# Patient Record
Sex: Female | Born: 1935 | Race: Black or African American | Hispanic: No | Marital: Single | Smoking: Never smoker
Health system: Southern US, Community
[De-identification: ages and names within clinical notes are randomized; demographics above are authoritative.]

## PROBLEM LIST (undated history)

## (undated) DIAGNOSIS — R51 Headache: Secondary | ICD-10-CM

## (undated) DIAGNOSIS — F0280 Dementia in other diseases classified elsewhere without behavioral disturbance: Secondary | ICD-10-CM

## (undated) DIAGNOSIS — E7800 Pure hypercholesterolemia, unspecified: Secondary | ICD-10-CM

## (undated) DIAGNOSIS — M1990 Unspecified osteoarthritis, unspecified site: Secondary | ICD-10-CM

## (undated) DIAGNOSIS — Z531 Procedure and treatment not carried out because of patient's decision for reasons of belief and group pressure: Secondary | ICD-10-CM

## (undated) DIAGNOSIS — F329 Major depressive disorder, single episode, unspecified: Secondary | ICD-10-CM

## (undated) DIAGNOSIS — D649 Anemia, unspecified: Secondary | ICD-10-CM

## (undated) DIAGNOSIS — F32A Depression, unspecified: Secondary | ICD-10-CM

## (undated) DIAGNOSIS — IMO0001 Reserved for inherently not codable concepts without codable children: Secondary | ICD-10-CM

## (undated) DIAGNOSIS — I509 Heart failure, unspecified: Secondary | ICD-10-CM

## (undated) DIAGNOSIS — I639 Cerebral infarction, unspecified: Secondary | ICD-10-CM

## (undated) DIAGNOSIS — E119 Type 2 diabetes mellitus without complications: Secondary | ICD-10-CM

## (undated) DIAGNOSIS — G309 Alzheimer's disease, unspecified: Secondary | ICD-10-CM

## (undated) DIAGNOSIS — I10 Essential (primary) hypertension: Secondary | ICD-10-CM

## (undated) DIAGNOSIS — G43909 Migraine, unspecified, not intractable, without status migrainosus: Secondary | ICD-10-CM

## (undated) DIAGNOSIS — I679 Cerebrovascular disease, unspecified: Secondary | ICD-10-CM

## (undated) DIAGNOSIS — K219 Gastro-esophageal reflux disease without esophagitis: Secondary | ICD-10-CM

## (undated) DIAGNOSIS — G459 Transient cerebral ischemic attack, unspecified: Secondary | ICD-10-CM

## (undated) DIAGNOSIS — I96 Gangrene, not elsewhere classified: Secondary | ICD-10-CM

## (undated) HISTORY — PX: CATARACT EXTRACTION W/ INTRAOCULAR LENS  IMPLANT, BILATERAL: SHX1307

---

## 1999-06-17 ENCOUNTER — Encounter: Admission: RE | Admit: 1999-06-17 | Discharge: 1999-06-17 | Payer: Self-pay | Admitting: Hematology and Oncology

## 1999-08-17 ENCOUNTER — Encounter: Admission: RE | Admit: 1999-08-17 | Discharge: 1999-08-17 | Payer: Self-pay | Admitting: Internal Medicine

## 1999-10-19 ENCOUNTER — Encounter: Admission: RE | Admit: 1999-10-19 | Discharge: 1999-10-19 | Payer: Self-pay | Admitting: Hematology and Oncology

## 1999-10-28 ENCOUNTER — Encounter: Admission: RE | Admit: 1999-10-28 | Discharge: 1999-10-28 | Payer: Self-pay | Admitting: *Deleted

## 2000-01-13 ENCOUNTER — Inpatient Hospital Stay (HOSPITAL_COMMUNITY): Admission: EM | Admit: 2000-01-13 | Discharge: 2000-01-14 | Payer: Self-pay | Admitting: Emergency Medicine

## 2000-01-13 ENCOUNTER — Encounter: Payer: Self-pay | Admitting: Emergency Medicine

## 2000-01-13 ENCOUNTER — Encounter: Payer: Self-pay | Admitting: Neurology

## 2000-01-25 ENCOUNTER — Encounter: Admission: RE | Admit: 2000-01-25 | Discharge: 2000-01-25 | Payer: Self-pay | Admitting: Internal Medicine

## 2000-03-27 ENCOUNTER — Encounter: Admission: RE | Admit: 2000-03-27 | Discharge: 2000-03-27 | Payer: Self-pay | Admitting: Internal Medicine

## 2000-10-03 ENCOUNTER — Encounter: Admission: RE | Admit: 2000-10-03 | Discharge: 2000-10-03 | Payer: Self-pay | Admitting: Internal Medicine

## 2000-10-31 ENCOUNTER — Encounter: Admission: RE | Admit: 2000-10-31 | Discharge: 2000-10-31 | Payer: Self-pay

## 2000-11-20 ENCOUNTER — Encounter: Admission: RE | Admit: 2000-11-20 | Discharge: 2000-11-20 | Payer: Self-pay | Admitting: Internal Medicine

## 2000-12-04 ENCOUNTER — Encounter: Admission: RE | Admit: 2000-12-04 | Discharge: 2000-12-04 | Payer: Self-pay | Admitting: Internal Medicine

## 2001-01-22 ENCOUNTER — Encounter: Admission: RE | Admit: 2001-01-22 | Discharge: 2001-01-22 | Payer: Self-pay | Admitting: Internal Medicine

## 2001-01-30 ENCOUNTER — Encounter: Admission: RE | Admit: 2001-01-30 | Discharge: 2001-01-30 | Payer: Self-pay | Admitting: Internal Medicine

## 2001-02-01 ENCOUNTER — Emergency Department (HOSPITAL_COMMUNITY): Admission: EM | Admit: 2001-02-01 | Discharge: 2001-02-01 | Payer: Self-pay | Admitting: Emergency Medicine

## 2001-02-10 ENCOUNTER — Emergency Department (HOSPITAL_COMMUNITY): Admission: EM | Admit: 2001-02-10 | Discharge: 2001-02-10 | Payer: Self-pay | Admitting: Emergency Medicine

## 2001-02-12 ENCOUNTER — Encounter: Admission: RE | Admit: 2001-02-12 | Discharge: 2001-02-12 | Payer: Self-pay | Admitting: Internal Medicine

## 2001-03-24 ENCOUNTER — Emergency Department (HOSPITAL_COMMUNITY): Admission: EM | Admit: 2001-03-24 | Discharge: 2001-03-25 | Payer: Self-pay | Admitting: Emergency Medicine

## 2001-03-28 ENCOUNTER — Encounter: Admission: RE | Admit: 2001-03-28 | Discharge: 2001-03-28 | Payer: Self-pay | Admitting: Internal Medicine

## 2001-04-26 ENCOUNTER — Encounter: Admission: RE | Admit: 2001-04-26 | Discharge: 2001-04-26 | Payer: Self-pay | Admitting: Internal Medicine

## 2001-05-11 ENCOUNTER — Encounter: Admission: RE | Admit: 2001-05-11 | Discharge: 2001-05-11 | Payer: Self-pay | Admitting: Internal Medicine

## 2001-11-10 HISTORY — PX: CARDIAC CATHETERIZATION: SHX172

## 2001-11-17 ENCOUNTER — Emergency Department (HOSPITAL_COMMUNITY): Admission: EM | Admit: 2001-11-17 | Discharge: 2001-11-17 | Payer: Self-pay | Admitting: Emergency Medicine

## 2001-11-17 ENCOUNTER — Encounter: Payer: Self-pay | Admitting: Emergency Medicine

## 2001-11-19 ENCOUNTER — Inpatient Hospital Stay (HOSPITAL_COMMUNITY): Admission: EM | Admit: 2001-11-19 | Discharge: 2001-11-22 | Payer: Self-pay | Admitting: Emergency Medicine

## 2001-11-19 ENCOUNTER — Encounter: Payer: Self-pay | Admitting: Emergency Medicine

## 2001-12-07 ENCOUNTER — Inpatient Hospital Stay (HOSPITAL_COMMUNITY): Admission: EM | Admit: 2001-12-07 | Discharge: 2001-12-08 | Payer: Self-pay | Admitting: Emergency Medicine

## 2001-12-07 ENCOUNTER — Encounter: Payer: Self-pay | Admitting: Emergency Medicine

## 2001-12-26 ENCOUNTER — Encounter: Admission: RE | Admit: 2001-12-26 | Discharge: 2001-12-26 | Payer: Self-pay | Admitting: Internal Medicine

## 2002-01-30 ENCOUNTER — Encounter: Admission: RE | Admit: 2002-01-30 | Discharge: 2002-01-30 | Payer: Self-pay | Admitting: Internal Medicine

## 2002-06-05 ENCOUNTER — Encounter: Admission: RE | Admit: 2002-06-05 | Discharge: 2002-06-05 | Payer: Self-pay | Admitting: Internal Medicine

## 2002-06-24 ENCOUNTER — Encounter: Admission: RE | Admit: 2002-06-24 | Discharge: 2002-06-24 | Payer: Self-pay | Admitting: Internal Medicine

## 2002-07-19 ENCOUNTER — Emergency Department (HOSPITAL_COMMUNITY): Admission: EM | Admit: 2002-07-19 | Discharge: 2002-07-19 | Payer: Self-pay | Admitting: Emergency Medicine

## 2002-10-30 ENCOUNTER — Encounter: Admission: RE | Admit: 2002-10-30 | Discharge: 2002-10-30 | Payer: Self-pay | Admitting: Internal Medicine

## 2002-11-19 ENCOUNTER — Encounter: Admission: RE | Admit: 2002-11-19 | Discharge: 2002-11-19 | Payer: Self-pay | Admitting: Internal Medicine

## 2002-12-10 ENCOUNTER — Encounter: Admission: RE | Admit: 2002-12-10 | Discharge: 2002-12-10 | Payer: Self-pay | Admitting: Internal Medicine

## 2002-12-30 ENCOUNTER — Encounter: Admission: RE | Admit: 2002-12-30 | Discharge: 2002-12-30 | Payer: Self-pay | Admitting: Internal Medicine

## 2003-01-08 ENCOUNTER — Encounter: Admission: RE | Admit: 2003-01-08 | Discharge: 2003-01-08 | Payer: Self-pay | Admitting: Internal Medicine

## 2003-01-30 ENCOUNTER — Encounter: Admission: RE | Admit: 2003-01-30 | Discharge: 2003-01-30 | Payer: Self-pay | Admitting: Internal Medicine

## 2003-03-06 ENCOUNTER — Encounter: Admission: RE | Admit: 2003-03-06 | Discharge: 2003-03-06 | Payer: Self-pay | Admitting: Internal Medicine

## 2003-04-02 ENCOUNTER — Encounter: Admission: RE | Admit: 2003-04-02 | Discharge: 2003-04-02 | Payer: Self-pay | Admitting: Internal Medicine

## 2003-06-07 ENCOUNTER — Inpatient Hospital Stay (HOSPITAL_COMMUNITY): Admission: EM | Admit: 2003-06-07 | Discharge: 2003-06-09 | Payer: Self-pay | Admitting: Emergency Medicine

## 2003-06-07 ENCOUNTER — Encounter: Payer: Self-pay | Admitting: Emergency Medicine

## 2003-06-09 ENCOUNTER — Encounter (INDEPENDENT_AMBULATORY_CARE_PROVIDER_SITE_OTHER): Payer: Self-pay | Admitting: Cardiology

## 2003-06-18 ENCOUNTER — Encounter: Admission: RE | Admit: 2003-06-18 | Discharge: 2003-06-18 | Payer: Self-pay | Admitting: Internal Medicine

## 2003-07-16 ENCOUNTER — Encounter: Admission: RE | Admit: 2003-07-16 | Discharge: 2003-07-16 | Payer: Self-pay | Admitting: Internal Medicine

## 2003-08-13 ENCOUNTER — Encounter: Admission: RE | Admit: 2003-08-13 | Discharge: 2003-08-13 | Payer: Self-pay | Admitting: Internal Medicine

## 2003-09-16 ENCOUNTER — Inpatient Hospital Stay (HOSPITAL_COMMUNITY): Admission: EM | Admit: 2003-09-16 | Discharge: 2003-09-24 | Payer: Self-pay | Admitting: Emergency Medicine

## 2003-09-17 ENCOUNTER — Encounter: Payer: Self-pay | Admitting: Internal Medicine

## 2003-11-04 ENCOUNTER — Encounter: Admission: RE | Admit: 2003-11-04 | Discharge: 2003-11-04 | Payer: Self-pay | Admitting: Internal Medicine

## 2003-12-23 ENCOUNTER — Encounter: Admission: RE | Admit: 2003-12-23 | Discharge: 2003-12-23 | Payer: Self-pay | Admitting: Internal Medicine

## 2004-01-27 ENCOUNTER — Encounter: Admission: RE | Admit: 2004-01-27 | Discharge: 2004-01-27 | Payer: Self-pay | Admitting: Internal Medicine

## 2004-03-19 ENCOUNTER — Encounter: Admission: RE | Admit: 2004-03-19 | Discharge: 2004-03-19 | Payer: Self-pay | Admitting: Internal Medicine

## 2004-08-26 ENCOUNTER — Ambulatory Visit (HOSPITAL_COMMUNITY): Admission: RE | Admit: 2004-08-26 | Discharge: 2004-08-26 | Payer: Self-pay | Admitting: Gastroenterology

## 2005-07-18 ENCOUNTER — Emergency Department (HOSPITAL_COMMUNITY): Admission: EM | Admit: 2005-07-18 | Discharge: 2005-07-18 | Payer: Self-pay | Admitting: Emergency Medicine

## 2006-02-15 ENCOUNTER — Emergency Department (HOSPITAL_COMMUNITY): Admission: EM | Admit: 2006-02-15 | Discharge: 2006-02-16 | Payer: Self-pay | Admitting: Emergency Medicine

## 2006-09-22 ENCOUNTER — Emergency Department (HOSPITAL_COMMUNITY): Admission: EM | Admit: 2006-09-22 | Discharge: 2006-09-22 | Payer: Self-pay | Admitting: Family Medicine

## 2007-05-08 ENCOUNTER — Emergency Department (HOSPITAL_COMMUNITY): Admission: EM | Admit: 2007-05-08 | Discharge: 2007-05-08 | Payer: Self-pay | Admitting: Emergency Medicine

## 2007-07-22 ENCOUNTER — Inpatient Hospital Stay (HOSPITAL_COMMUNITY): Admission: EM | Admit: 2007-07-22 | Discharge: 2007-07-24 | Payer: Self-pay | Admitting: Emergency Medicine

## 2007-11-19 ENCOUNTER — Emergency Department (HOSPITAL_COMMUNITY): Admission: EM | Admit: 2007-11-19 | Discharge: 2007-11-19 | Payer: Self-pay | Admitting: Emergency Medicine

## 2009-12-18 ENCOUNTER — Emergency Department (HOSPITAL_COMMUNITY): Admission: EM | Admit: 2009-12-18 | Discharge: 2009-12-18 | Payer: Self-pay | Admitting: Emergency Medicine

## 2010-08-19 ENCOUNTER — Inpatient Hospital Stay (HOSPITAL_COMMUNITY): Admission: EM | Admit: 2010-08-19 | Discharge: 2010-02-04 | Payer: Self-pay | Admitting: Emergency Medicine

## 2010-11-29 LAB — DIFFERENTIAL
Basophils Absolute: 0 10*3/uL (ref 0.0–0.1)
Basophils Absolute: 0.1 10*3/uL (ref 0.0–0.1)
Basophils Relative: 1 % (ref 0–1)
Eosinophils Absolute: 0 10*3/uL (ref 0.0–0.7)
Eosinophils Relative: 0 % (ref 0–5)
Lymphocytes Relative: 19 % (ref 12–46)
Lymphocytes Relative: 30 % (ref 12–46)
Lymphs Abs: 1.7 10*3/uL (ref 0.7–4.0)
Monocytes Absolute: 0.8 10*3/uL (ref 0.1–1.0)
Monocytes Relative: 9 % (ref 3–12)
Neutro Abs: 4.9 10*3/uL (ref 1.7–7.7)
Neutro Abs: 6.5 10*3/uL (ref 1.7–7.7)
Neutrophils Relative %: 72 % (ref 43–77)

## 2010-11-29 LAB — BASIC METABOLIC PANEL
BUN: 29 mg/dL — ABNORMAL HIGH (ref 6–23)
CO2: 27 mEq/L (ref 19–32)
Calcium: 9 mg/dL (ref 8.4–10.5)
GFR calc non Af Amer: 42 mL/min — ABNORMAL LOW (ref >60)
GFR calc non Af Amer: 48 mL/min — ABNORMAL LOW (ref >60)
Glucose, Bld: 113 mg/dL — ABNORMAL HIGH (ref 70–99)
Glucose, Bld: 160 mg/dL — ABNORMAL HIGH (ref 70–99)
Potassium: 4 mEq/L (ref 3.5–5.1)
Sodium: 141 mEq/L (ref 135–145)
Sodium: 144 mEq/L (ref 135–145)

## 2010-11-29 LAB — CBC
HCT: 31.1 % — ABNORMAL LOW (ref 36.0–46.0)
HCT: 33.4 % — ABNORMAL LOW (ref 36.0–46.0)
Hemoglobin: 10.4 g/dL — ABNORMAL LOW (ref 12.0–15.0)
Hemoglobin: 10.8 g/dL — ABNORMAL LOW (ref 12.0–15.0)
Hemoglobin: 9.7 g/dL — ABNORMAL LOW (ref 12.0–15.0)
MCHC: 32.3 g/dL (ref 30.0–36.0)
MCHC: 33.1 g/dL (ref 30.0–36.0)
MCV: 92.9 fL (ref 78.0–100.0)
MCV: 93.5 fL (ref 78.0–100.0)
Platelets: 179 10*3/uL (ref 150–400)
Platelets: 184 10*3/uL (ref 150–400)
Platelets: 202 10*3/uL (ref 150–400)
RBC: 3.57 MIL/uL — ABNORMAL LOW (ref 3.87–5.11)
RDW: 15.9 % — ABNORMAL HIGH (ref 11.5–15.5)
RDW: 16.6 % — ABNORMAL HIGH (ref 11.5–15.5)
RDW: 16.8 % — ABNORMAL HIGH (ref 11.5–15.5)
RDW: 16.9 % — ABNORMAL HIGH (ref 11.5–15.5)
WBC: 9.1 10*3/uL (ref 4.0–10.5)

## 2010-11-29 LAB — URINE CULTURE: Colony Count: >=100000

## 2010-11-29 LAB — COMPREHENSIVE METABOLIC PANEL
AST: 15 U/L (ref 0–37)
Albumin: 3 g/dL — ABNORMAL LOW (ref 3.5–5.2)
CO2: 27 mEq/L (ref 19–32)
Calcium: 9.5 mg/dL (ref 8.4–10.5)
Creatinine, Ser: 1.3 mg/dL — ABNORMAL HIGH (ref 0.4–1.2)
GFR calc Af Amer: 48 mL/min — ABNORMAL LOW (ref >60)
GFR calc non Af Amer: 39 mL/min — ABNORMAL LOW (ref >60)
Sodium: 143 mEq/L (ref 135–145)
Total Protein: 5.7 g/dL — ABNORMAL LOW (ref 6.0–8.3)

## 2010-11-29 LAB — POCT I-STAT, CHEM 8
BUN: 40 mg/dL — ABNORMAL HIGH (ref 6–23)
Calcium, Ion: 1.16 mmol/L (ref 1.12–1.32)
Chloride: 109 mEq/L (ref 96–112)
Creatinine, Ser: 1.3 mg/dL — ABNORMAL HIGH (ref 0.4–1.2)
Glucose, Bld: 259 mg/dL — ABNORMAL HIGH (ref 70–99)
HCT: 35 % — ABNORMAL LOW (ref 36.0–46.0)
Hemoglobin: 11.9 g/dL — ABNORMAL LOW (ref 12.0–15.0)
Potassium: 4.3 mEq/L (ref 3.5–5.1)
Sodium: 140 mEq/L (ref 135–145)
TCO2: 24 mmol/L (ref 0–100)

## 2010-11-29 LAB — GLUCOSE, CAPILLARY
Glucose-Capillary: 124 mg/dL — ABNORMAL HIGH (ref 70–99)
Glucose-Capillary: 137 mg/dL — ABNORMAL HIGH (ref 70–99)
Glucose-Capillary: 258 mg/dL — ABNORMAL HIGH (ref 70–99)
Glucose-Capillary: 260 mg/dL — ABNORMAL HIGH (ref 70–99)
Glucose-Capillary: 92 mg/dL (ref 70–99)

## 2010-11-29 LAB — URINE MICROSCOPIC-ADD ON

## 2010-11-29 LAB — LIPID PANEL
Cholesterol: 140 mg/dL (ref 0–200)
HDL: 55 mg/dL (ref >39)
LDL Cholesterol: 78 mg/dL (ref 0–99)

## 2010-11-29 LAB — URINALYSIS, ROUTINE W REFLEX MICROSCOPIC
Bilirubin Urine: NEGATIVE
Glucose, UA: NEGATIVE mg/dL
Hgb urine dipstick: NEGATIVE
Ketones, ur: NEGATIVE mg/dL
Nitrite: NEGATIVE
Protein, ur: NEGATIVE mg/dL
Specific Gravity, Urine: 1.024 (ref 1.005–1.030)
Urobilinogen, UA: 1 mg/dL (ref 0.0–1.0)
pH: 5 (ref 5.0–8.0)

## 2010-11-29 LAB — MAGNESIUM: Magnesium: 1.8 mg/dL (ref 1.5–2.5)

## 2010-11-29 LAB — AMMONIA: Ammonia: 17 umol/L (ref 11–35)

## 2010-11-29 LAB — TSH: TSH: 0.597 u[IU]/mL (ref 0.350–4.500)

## 2010-12-01 LAB — DIFFERENTIAL
Basophils Relative: 0 % (ref 0–1)
Lymphs Abs: 2.7 10*3/uL (ref 0.7–4.0)
Monocytes Relative: 9 % (ref 3–12)
Neutro Abs: 3.1 10*3/uL (ref 1.7–7.7)
Neutrophils Relative %: 48 % (ref 43–77)

## 2010-12-01 LAB — URINALYSIS, ROUTINE W REFLEX MICROSCOPIC
Hgb urine dipstick: NEGATIVE
Specific Gravity, Urine: 1.021 (ref 1.005–1.030)
Urobilinogen, UA: 1 mg/dL (ref 0.0–1.0)

## 2010-12-01 LAB — CBC
MCHC: 32.7 g/dL (ref 30.0–36.0)
RBC: 3.61 MIL/uL — ABNORMAL LOW (ref 3.87–5.11)
WBC: 6.5 10*3/uL (ref 4.0–10.5)

## 2010-12-01 LAB — URINE MICROSCOPIC-ADD ON

## 2010-12-01 LAB — BASIC METABOLIC PANEL
Calcium: 9.3 mg/dL (ref 8.4–10.5)
Creatinine, Ser: 1.41 mg/dL — ABNORMAL HIGH (ref 0.4–1.2)
GFR calc Af Amer: 43 mL/min — ABNORMAL LOW (ref >60)

## 2010-12-01 LAB — URINE CULTURE

## 2010-12-06 ENCOUNTER — Emergency Department (HOSPITAL_COMMUNITY): Payer: Medicare Other

## 2010-12-06 ENCOUNTER — Inpatient Hospital Stay (HOSPITAL_COMMUNITY)
Admission: EM | Admit: 2010-12-06 | Discharge: 2010-12-08 | DRG: 312 | Disposition: A | Discharge status: Skilled Nursing Facility | Payer: Medicare Other | Attending: Internal Medicine | Admitting: Internal Medicine

## 2010-12-06 DIAGNOSIS — I10 Essential (primary) hypertension: Secondary | ICD-10-CM | POA: Diagnosis present

## 2010-12-06 DIAGNOSIS — E785 Hyperlipidemia, unspecified: Secondary | ICD-10-CM | POA: Diagnosis present

## 2010-12-06 DIAGNOSIS — E119 Type 2 diabetes mellitus without complications: Secondary | ICD-10-CM | POA: Diagnosis present

## 2010-12-06 DIAGNOSIS — Z8673 Personal history of transient ischemic attack (TIA), and cerebral infarction without residual deficits: Secondary | ICD-10-CM

## 2010-12-06 DIAGNOSIS — Z794 Long term (current) use of insulin: Secondary | ICD-10-CM

## 2010-12-06 DIAGNOSIS — R55 Syncope and collapse: Principal | ICD-10-CM | POA: Diagnosis present

## 2010-12-06 DIAGNOSIS — K219 Gastro-esophageal reflux disease without esophagitis: Secondary | ICD-10-CM | POA: Diagnosis present

## 2010-12-06 DIAGNOSIS — F0390 Unspecified dementia without behavioral disturbance: Secondary | ICD-10-CM | POA: Diagnosis present

## 2010-12-06 DIAGNOSIS — I959 Hypotension, unspecified: Secondary | ICD-10-CM | POA: Diagnosis present

## 2010-12-06 LAB — CBC
MCH: 28.3 pg (ref 26.0–34.0)
Platelets: 201 10*3/uL (ref 150–400)
RBC: 4.06 MIL/uL (ref 3.87–5.11)
RDW: 16.7 % — ABNORMAL HIGH (ref 11.5–15.5)
WBC: 8.7 10*3/uL (ref 4.0–10.5)

## 2010-12-06 LAB — COMPREHENSIVE METABOLIC PANEL
BUN: 21 mg/dL (ref 6–23)
Calcium: 9 mg/dL (ref 8.4–10.5)
Creatinine, Ser: 1.11 mg/dL (ref 0.4–1.2)
GFR calc non Af Amer: 47 mL/min — ABNORMAL LOW (ref >60)
Glucose, Bld: 179 mg/dL — ABNORMAL HIGH (ref 70–99)
Total Protein: 6.7 g/dL (ref 6.0–8.3)

## 2010-12-06 LAB — OCCULT BLOOD, POC DEVICE: Fecal Occult Bld: NEGATIVE

## 2010-12-06 LAB — DIFFERENTIAL
Basophils Relative: 0 % (ref 0–1)
Eosinophils Absolute: 0.1 10*3/uL (ref 0.0–0.7)
Eosinophils Relative: 1 % (ref 0–5)
Neutrophils Relative %: 67 % (ref 43–77)

## 2010-12-06 LAB — POCT CARDIAC MARKERS
Myoglobin, poc: 61.1 ng/mL (ref 12–200)
Troponin i, poc: <0.05 ng/mL (ref 0.00–0.09)

## 2010-12-06 LAB — GLUCOSE, CAPILLARY: Glucose-Capillary: 164 mg/dL — ABNORMAL HIGH (ref 70–99)

## 2010-12-07 DIAGNOSIS — I319 Disease of pericardium, unspecified: Secondary | ICD-10-CM

## 2010-12-07 DIAGNOSIS — R55 Syncope and collapse: Secondary | ICD-10-CM

## 2010-12-07 LAB — CK TOTAL AND CKMB (NOT AT ARMC)
CK, MB: 0.4 ng/mL (ref 0.3–4.0)
CK, MB: 0.5 ng/mL (ref 0.3–4.0)
Relative Index: INVALID (ref 0.0–2.5)
Relative Index: INVALID (ref 0.0–2.5)
Total CK: 26 U/L (ref 7–177)
Total CK: 22 U/L (ref 7–177)

## 2010-12-07 LAB — TROPONIN I
Troponin I: 0.02 ng/mL (ref 0.00–0.06)
Troponin I: 0.03 ng/mL (ref 0.00–0.06)
Troponin I: 0.02 ng/mL (ref 0.00–0.06)

## 2010-12-07 LAB — COMPREHENSIVE METABOLIC PANEL
ALT: 20 U/L (ref 0–35)
AST: 32 U/L (ref 0–37)
Albumin: 3 g/dL — ABNORMAL LOW (ref 3.5–5.2)
Alkaline Phosphatase: 71 U/L (ref 39–117)
Calcium: 9.4 mg/dL (ref 8.4–10.5)
GFR calc Af Amer: >60 mL/min (ref >60)
Glucose, Bld: 97 mg/dL (ref 70–99)
Potassium: 4.8 mEq/L (ref 3.5–5.1)
Sodium: 141 mEq/L (ref 135–145)
Total Protein: 6.3 g/dL (ref 6.0–8.3)

## 2010-12-07 LAB — CBC
HCT: 33.7 % — ABNORMAL LOW (ref 36.0–46.0)
MCHC: 31.8 g/dL (ref 30.0–36.0)
RDW: 16.8 % — ABNORMAL HIGH (ref 11.5–15.5)
WBC: 6.5 10*3/uL (ref 4.0–10.5)

## 2010-12-07 LAB — GLUCOSE, CAPILLARY
Glucose-Capillary: 105 mg/dL — ABNORMAL HIGH (ref 70–99)
Glucose-Capillary: 88 mg/dL (ref 70–99)
Glucose-Capillary: 123 mg/dL — ABNORMAL HIGH (ref 70–99)

## 2010-12-07 LAB — DIFFERENTIAL
Basophils Absolute: 0 10*3/uL (ref 0.0–0.1)
Basophils Relative: 1 % (ref 0–1)
Eosinophils Relative: 2 % (ref 0–5)
Lymphocytes Relative: 44 % (ref 12–46)
Monocytes Absolute: 0.6 10*3/uL (ref 0.1–1.0)
Neutro Abs: 2.9 10*3/uL (ref 1.7–7.7)

## 2010-12-08 ENCOUNTER — Other Ambulatory Visit (HOSPITAL_COMMUNITY): Payer: Medicaid Other

## 2010-12-08 LAB — COMPREHENSIVE METABOLIC PANEL
AST: 27 U/L (ref 0–37)
CO2: 26 mEq/L (ref 19–32)
Calcium: 9.1 mg/dL (ref 8.4–10.5)
Creatinine, Ser: 0.81 mg/dL (ref 0.4–1.2)
GFR calc Af Amer: >60 mL/min (ref >60)
GFR calc non Af Amer: >60 mL/min (ref >60)
Total Protein: 6.1 g/dL (ref 6.0–8.3)

## 2010-12-08 LAB — VITAMIN B12: Vitamin B-12: 507 pg/mL (ref 211–911)

## 2010-12-08 LAB — CBC
Hemoglobin: 10.8 g/dL — ABNORMAL LOW (ref 12.0–15.0)
MCH: 28.2 pg (ref 26.0–34.0)
MCHC: 32.3 g/dL (ref 30.0–36.0)
Platelets: 196 10*3/uL (ref 150–400)
RDW: 16.6 % — ABNORMAL HIGH (ref 11.5–15.5)

## 2010-12-08 LAB — MAGNESIUM: Magnesium: 1.9 mg/dL (ref 1.5–2.5)

## 2010-12-08 LAB — RPR: RPR Ser Ql: NONREACTIVE

## 2010-12-08 LAB — GLUCOSE, CAPILLARY: Glucose-Capillary: 148 mg/dL — ABNORMAL HIGH (ref 70–99)

## 2010-12-08 LAB — LIPID PANEL
HDL: 45 mg/dL (ref >39)
Triglycerides: 51 mg/dL (ref <150)

## 2010-12-15 NOTE — Discharge Summary (Signed)
[name], [name]                 ACCOUNT NO.:  [id number]  MEDICAL RECORD NO.:  [id number]           PATIENT TYPE:  I  LOCATION:  3737                         FACILITY:  [facility]  PHYSICIAN:  Thad Ranger, MD       DATE OF BIRTH:  Jan 21, 1927  DATE OF ADMISSION:  12/06/2010 DATE OF DISCHARGE:                              DISCHARGE SUMMARY   PRIMARY CARE PHYSICIAN:  Bennett Scrape, MD with the [facility].  DISCHARGE DIAGNOSIS: 1. Syncopal episode likely vasovagal. 2. Hypertension, poorly controlled. 3. Diabetes type 2. 4. Gastroesophageal reflux disease. 5. Hypotension. 6. Hyperlipidemia. 7. History of cerebrovascular accident.  DISCHARGE MEDICATIONS: 1. Clonidine 0.1 mg p.o. b.i.d. 2. Tylenol 325 mg 2 tablets p.o. every 6 hours as needed for     pain/fever. 3. Aricept 10 mg p.o. daily at bedtime. 4. Beneprotein 1 scoop p.o. t.i.d. 5. Aspirin 81 mg daily. 6. Certa-Vite 1 tablet p.o. daily. 7. Citalopram 10 mg p.o. daily. 8. Ferrous sulfate 325 mg p.o. daily. 9. Lasix 20 mg p.o. q.a.m. 10.Lantus 5 units subcu daily at bedtime. 11.Lisinopril 40 mg p.o. daily. 12.Nexium 40 mg p.o. q.a.m. 13.NovoLog insulin sliding scale. 14.Phenergan 25 mg 1 tab p.o. every 4 hours as needed for nausea. 15.Potassium 10 mEq p.o. daily. 16.Simvastatin 40 mg p.o. daily. 17.Toprol-XL 100 mg p.o. daily. 18.Vicodin 5/500, 1 tablet p.o. every 6 hours as needed for pain. 19.Namenda 10 mg p.o. b.i.d.  HISTORY OF PRESENT ILLNESS:  At the time of admission, [name] is a 12- year-old female with history of diabetes type 2, mild dementia. Apparently was at the nursing home and she had an episode of syncope. It was unclear exactly how the syncope occurred or who witnessed it. The patient otherwise denied any fever, chills, chest pain, palpitation, shortness of breath, nausea, vomiting, or any bright red blood per rectum or black stools.  The patient was admitted for workup of  syncope. Chest x-ray 2-view March 26, emphysematous and bronchitic changes with mild chronic right basilar atelectasis, enlargement of the cardiac silhouette, no acute infiltrates.  CT head without contrast on March 26 showed atrophy with small vessel chronic ischemic changes of deep cerebral white matter, tiny old basal ganglia, lacunar infarcts, no acute intracranial abnormalities, pacified left mastoid air cells, question mastoiditis.  A 2-D echo March 27, EF of 50-55%, no definitive wall motion abnormalities, grade 1 diastolic dysfunction.  Carotid Doppler's March 27, no significant extracranial carotid artery stenosis demonstrated.  PERTINENT LAB DIAGNOSTIC DATA: 1. HbA1c 6.8 with a mean plasma glucose of 148.  Cardiac enzymes     remained negative x3. 2. Lipid profile:  Cholesterol 129, HDL 45, LDL 74. 3. TSH 1.0. 4. B12 507.  BRIEF HOSPITALIZATION COURSE:  [name] is a 77 year old female with history of dementia, hypertension, hyperlipidemia, history of CVA presented with a syncopal episode. 1. Syncope, likely vasovagal.  The patient was admitted to the     monitored floor.  She underwent 2-D echocardiogram and was     essentially normal EF with 50-55% and no wall motion abnormalities.     Carotid  ultrasound also showed no ICA stenosis.  CT head did not     show any acute stroke.  Given that the patient had CVA with old     basal ganglia lacunar infarcts and small vessel chronic ischemic     changes, the patient was started on low-dose aspirin. 2. Hypertension was somewhat poorly controlled.  The patient was     continued on lisinopril, Toprol-XL, and Lasix.  Clonidine was     started during the hospitalization. 3. Dementia remained stable.  The patient was continued on Aricept and     Namenda. 4. Diabetes mellitus.  The patient was continued on Lantus and sliding     scale insulin while inpatient.  PHYSICAL EXAMINATION:  VITAL SIGNS AT THE TIME OF  DICTATION: Temperature 98.7, pulse 64, respirations 18, blood pressure 171/81, O2 sats 99% on room air.  Currently verbalizing at the baseline. HEENT:  Negative sclerae and conjunctivae.  Pupils reactive to light and accommodation.  EOMI. NECK:  Supple.  No lymphopathy, no JVD. CARDIOVASCULAR:  S1, S2 clear.  Regular rate and rhythm. CHEST:  Clear to auscultation bilaterally. ABDOMEN:  Soft, nontender, no bowel sounds. EXTREMITIES:  No cyanosis or clubbing.  LAB DIAGNOSTIC DATA TODAY:  Sodium 141, potassium 3.8, BUN 17, creatinine 0.8, albumin 2.8.  Sodium 141, potassium 3.8, BUN 17, creatinine 0.8.  DISCHARGE FOLLOWUP:  With [facility], Dr. Providence Crosby.  DISCHARGE TIME:  35 minutes.     Thad Ranger, MD    RR/MEDQ  D:  12/08/2010  T:  12/08/2010  Job:  [id number]  cc:   Bennett Scrape, MD  Electronically Signed by Andres Labrum Caelan Atchley  on 12/15/2010 04:35:49 PM

## 2011-01-12 NOTE — H&P (Signed)
[name], [name]                 ACCOUNT NO.:  [id number]  MEDICAL RECORD NO.:  [id number]           PATIENT TYPE:  E  LOCATION:  [facility]                         FACILITY:  [facility]  PHYSICIAN:  Massie Maroon, MD        DATE OF BIRTH:  09/24/1926  DATE OF ADMISSION:  12/06/2010 DATE OF DISCHARGE:                             HISTORY & PHYSICAL   CHIEF COMPLAINT:  Syncope.  HISTORY OF PRESENT ILLNESS:  This is an 77 year old female with a history of diabetes type 2, mild dementia apparently was at nursing home, when she had an episode of syncope.  It is unclear exactly how syncope occurred and who witnessed it.  The patient is not sure how long episode of syncope occurred.  The patient denies any fever, chills, cough, chest pain, palpitations, shortness of breath, nausea, vomiting, bright red blood per rectum or black stool.  The patient was brought to the ED for evaluation.  CT brain was negative for any acute process. There were tiny old basal ganglia lacunar infarcts in opacified left mastoid air cells question mastoiditis.  However, there was no tenderness over the mastoid processes.  Chest x-ray was unremarkable, other than COPD and bronchitic changes and some mild chronic right basilar atelectasis.  EKG showed normal sinus rhythm at 55 common left axis deviation, poor R-wave progression, Q-waves in V1, V2, V3 with ST elevation, which appears to be old.  The patient will be admitted for workup of syncope.  PAST MEDICAL HISTORY: 1. Hypertension. 2. Hyperlipidemia. 3. CAD status post MI. 4. TIA/CVA with right-sided residual effects. 5. Dementia, poor historian. 6. GERD. 7. UTI. 8. Anemia.  PAST SURGICAL HISTORY:  November 21, 2001, left heart cath severe apical disease of the LAD with involvement of some of the diagonal branches, EF 70-75%, 70-75% segment segmental focal segmental lesion of the AV circumflex supplying the PDA from September 23, 2003, AV nodal reentry tachycardia  status post invasive electrophysiological study with isopropanol infusion and radiofrequency energy catheter ablation, August 26, 2004, colonoscopy diverticulosis of the left colon to the hepatic flexure, August 26, 2004.  EGD-mild gastritis.  Angioplasty and C-section.  SOCIAL HISTORY:  The patient is a long-term resident [facility].  She does not smoke or drink.  FAMILY HISTORY:  Unknown due to the patient is a poor historian due to dementia.  Both parents are deceased.  Per prior HPI, family history of hypertension, mother died at age 47 with diabetes, hypertension.  Father died at an unknown age for no cause.  One sibling.  ALLERGIES:  No known drug allergies.  MEDICATIONS: 1. Vicodin 5/500 mg one p.o. q.6 h. P.r.n. 2. Phenergan 25 mg p.o. q.4 h. P.r.n. 3. Tylenol 650 mg p.o. q.6 h. P.r.n. 4. NovoLog sliding scale. 5. Lantus 5 units subcu at bedtime. 6. Certa-Vite multivitamin 1 p.o. daily. 7. Aricept 10 mg p.o. daily. 8. Beneprotein powder one scoop t.i.d. 9. Simvastatin 40 mg p.o. daily. 10.Namenda 10 mg p.o. b.i.d. 11.Lexapro 10 mg p.o. daily. 12.Potassium chloride 10 mEq p.o. daily. 13.Furosemide 20 mg p.o. daily. 14.Ferrous sulfate  325 mg p.o. daily. 15.Nexium 40 mg p.o. q.a.m. 16.Lisinopril 40 mg p.o. daily. 17.Toprol-XL 100 mg p.o. daily.  REVIEW OF SYSTEMS:  Unable to obtain due to the patient's dementia.  PHYSICAL EXAMINATION:  VITAL SIGNS:  Temperature 98.1, pulse 60, blood pressure 127/86, pulse ox 100% on room air. HEENT: Anicteric. NECK:  No JVD, no bruit. HEART:  Regular rate and rhythm.  S1 and S2.  No murmurs, gallops or rubs. LUNGS:  Clear to auscultation bilaterally. ABDOMEN:  Soft, nontender, nondistended.  Positive bowel sounds. EXTREMITIES:  No cyanosis, clubbing or edema. SKIN:  No rashes. LYMPH NODES:  No adenopathy. NEURO:  Nonfocal.  Cranial nerves II through XII intact.  Reflexes 2+, symmetric, diffuse with  downgoing toes bilaterally, motor strength 5/5 in all 4 extremities, pinprick intact.  LABORATORY DATA:  WBC 8.7, hemoglobin 11.5, platelet count 201. Troponin-I less than 0.05.  Sodium 134, potassium 4.4, BUN 21, creatinine 1.11, AST 26, ALT 21, alk phos 75, total bilirubin 0.3. Hemoccult stool negative.  CT brain, December 06, 2010, atrophy with small vessel chronic ischemic changes of the deep cerebral white matter, tiny old basal ganglia, lacunar infarcts, opacified left mastoid air cells, question mastoiditis.  Chest x-ray; COPD and bronchitic changes with mild chronic right basilar atelectasis, enlargement of the cardiac silhouette.  No acute infiltrate.  ASSESSMENT/PLAN: 1. Syncope.  Check orthostatic blood pressures.  The patient was     placed on telemetry.  We will check CK, CK-MB, troponin I q.6 h. x3     sets.  Check a carotid ultrasound and cardiac 2-D echo. 2. Diabetes type 2.  Fingerstick blood sugars a.c. and h.s. NovoLog     sensitive sliding scale.  Continue Lantus. 3. Dementia.  Continue Aricept.  Continue Namenda.  Continue Certa-     Vite. 4. Gastroesophageal reflux disease.  Continue Nexium. 5. Hypertension.  Continue lisinopril, Toprol-XL, and furosemide. 6. Hyperlipidemia:  Continue simvastatin. 7. History of cerebrovascular accident.  Start aspirin 81 mg p.o.     daily. 8. Deep venous thrombosis prophylaxis.  SCDs.     Massie Maroon, MD     JYK/MEDQ  D:  12/07/2010  T:  12/07/2010  Job:  [id number]  Electronically Signed by Pearson Grippe MD on 01/12/2011 11:24:26 PM

## 2011-01-28 NOTE — Discharge Summary (Signed)
. [facility]  Patient:    [name], [name]                        MRN: [id number] Adm. Date:  [id number] Disc. Date: [id number] Attending:  Glean Hess D CC:         Nancee Liter, M.D.,             [facility]                           Discharge Summary  DISCHARGE DIAGNOSES: 1. Vertebrobasilar insufficiency. 2. Hypertension. 3. Diabetes mellitus.  PROCEDURES AND INTERVENTIONS: 1. MRI/MRA of the brain. 2. Two-dimensional echocardiogram.  HOSPITAL COURSE:  Patient is a 77 year old woman who presented to [facility] emergency room with sudden onset the morning of admission, around 4:00 a.m., of double vision, nausea, and difficulty walking.  Patient denied weakness of the upper or lower extremities.  Upon admission, her neurological examination showed patient awake and alert with fluent speech, normal memory and language, pupils equal and reactive bilaterally, extraocular, cephalic movement normal.  She did revert to double vision on extreme bilateral gaze but her eyes were not disconjugate.  Also noted was a smaller palpebral fissure on the left eye but the patient ______ has been there since her last cataract surgery and is not a new finding for her.  Face symmetric, tongue in the midline.  Motor examination with strength equal bilaterally.  Patient was admitted to the hospital for further workup and testing for cerebrovascular disease in view of her risk factors.  She underwent an MRI/MRA of the brain which showed no acute infarctions on diffusion______ images.  It is well known that, in about 10 to 15% of cases, we can miss acute infarctions on diffusion images acutely.  MRA did not show any hemodynamic stenosis of the vertebrobasilar system.  A 2D echocardiogram showed normal LV function with no embolic sources of the cause of her stroke.  Patient was evaluated by PT and OT without further recommendations as an  outpatient.  During her hospital stay, her neurological status improved significantly.  She still had some double vision in bilateral gaze.  Patient was discharged home in stable condition.  She was placed on Plavix 75 mg once a day for secondary long-term stroke prevention.  She was advised not to drive and to call Dr. Gwendolyn Fill for outpatient follow-up in the residence clinic in two to three weeks from discharge. DD:  01/14/00 TD:  01/17/00 Job: [id number]

## 2011-01-28 NOTE — Consult Note (Signed)
[place]  Patient:    [name], [name] Visit Number: [id number] MRN: [id number]          Service Type: MED Location: [phone] Attending Physician:  Edwyna Perfect Dictated by:   Everardo Beals Juanda Chance, M.D. [facility] Proc. Date: 11/19/01 Admit Date:  11/19/2001                            Consultation Report  CONSULTING PHYSICIAN:  Medical Teaching Service and Dr. Bruce Donath.  REASON FOR CONSULTATION:  Evaluation of chest pain.  CLINICAL HISTORY:  [name] is 77 year old woman who has diabetes, hypertension and hyperlipidemia and a history of a remote myocardial infarction.  She has had intermittent chest pain for the past three days and was seen yesterday at [facility] and released.  She came back today to [facility] with recurrent chest pain.  She describes the pain as substernal and an aching quality and lasting for hours at a time.  She has had no exertional component.  There is no associated shortness of breath, nausea, vomiting or diaphoresis.  PAST MEDICAL HISTORY:  Her past medical history is significant for her hypertension, hyperlipidemia and diabetes.  She also has a history of GERD and glaucoma.  CURRENT MEDICATIONS: 1. Glucovance 5/500 b.i.d. 2. Lisinopril 40 mg q.d. 3. Aciphex 20 mg q.d. 4. Atenolol 50 mg q.d.  SOCIAL HISTORY:  She lives alone but her daughter lives with her.  She does not smoke and does not drink.  FAMILY HISTORY AND REVIEW OF SYSTEMS:  Please see [name], P.A.s note.  PHYSICAL EXAMINATION:  VITAL SIGNS:  Blood pressure 199/89 and a pulse 67 and regular and the respirations 20.  SKIN:  Warm and dry.  HEENT:  The pupils were equal and round and extraocular movements were full. There was no venous distention.  NECK:  The carotid pulses were full.  There was no thyroid enlargement.  CHEST:  Clear without rales or rhonchi.  CARDIAC:  The cardiac rhythm was regular.  First and second  heart sounds were normal and there were no murmurs or gallops.  ABDOMEN:  Soft without organomegaly.  The bowel sounds were normal.  EXTREMITIES:  Good pulses and no peripheral edema.  There were no musculoskeletal deformities.  NEUROLOGIC:  Examination was normal.  LABORATORY AND ACCESSORY DATA:  An EKG showed an old anteroseptal infarction and left axis deviation.  Her CKs and troponins were negative.  Her BUN and creatinine were 16/0.8.  IMPRESSION: 1. Chest pain suggestive of unstable angina. 2. Old anteroseptal myocardial infarction by electrocardiogram. 3. Hypertension. 4. Diabetes. 5. Hyperlipidemia.  RECOMMENDATIONS:  I am concerned that [name] symptoms may represent ischemia, especially in view of her multiple risk factors and abnormal ECG. If her pressure remains up, I would add an ACE inhibitor and would also put her on heparin.  I am concerned that she may be at high risk and would recommend evaluation with angiography. Dictated by:   Everardo Beals Juanda Chance, M.D. [facility] Attending Physician:  Edwyna Perfect DD:  11/19/01 TD:  11/20/01 Job: [id number]

## 2011-01-28 NOTE — H&P (Signed)
[place]  Patient:    [name], [name]                        MRN: [id number] Adm. Date:  [id number] Attending:  Doug Sou                         History and Physical  CHIEF COMPLAINT:  Double vision.  HISTORY OF PRESENT ILLNESS:  The patient is a 77 year old woman who earlier this morning around 4 a.m. developed the sudden onset of double vision, nausea, and difficulty walking.  She denies weakness of upper and lower extremities. Denies slurred speech.  The patient does refer that she was leaning towards the left side.  She has a prior history of a stroke which had left her with paresthesias on the  left side of her body.  PAST MEDICAL HISTORY: 1. Insulin-dependent diabetes mellitus. 2. Stroke. 3. Hypertension.  CURRENT MEDICATIONS: 1. Glyburide 5 mg once q.d. 2. Humulin 70/30, 10 units at night and 7 units in the morning p.r.n. 3. Aspirin one or two tablets once q.d. 4. Furosemide 80 mg once q.d. 5. Zestril 40 mg one q.d. 6. Atenolol 50b mg one q.d. 7. Catapres patch weekly, the dose which the patient does not remember    at this time.  ALLERGIES:  No known drug allergies.  [facility] care [facility]  SOCIAL HISTORY:  Lives with her daughter.  She is a nonsmoker, nondrinker.  She is very active and independent.  She still drives.  She is separated from her husband.  FAMILY HISTORY:  Mother had a stroke.  REVIEW OF SYSTEMS:  As per the history of present illness.  PHYSICAL EXAMINATION:  VITAL SIGNS:  Blood pressure 210/113, pulse 73, respirations 20, temperature 98.1 degrees.  GENERAL:  The patient is mildly obese, laying on the stretcher, in no distress.  HEENT:  Head normocephalic, atraumatic.  NECK:  Supple, no bruits.  LUNGS:  Clear bilaterally.  HEART:  Sounds, regular rhythm.  No murmurs.  ABDOMEN:  Soft, bowel sounds present.  No  visceromegaly.  EXTREMITIES:  With +1-2 edema.  NEUROLOGIC:  Awake, alert, oriented.  Speech fluent.  Memory and language appropriate for age.  Her pupils are equal, reactive bilaterally.  Extraocular cephalic movements intact.  She does refer diplopia on extreme right gaze. Also noted the left palpebral fissure was smaller than the right.  Her face is slightly asymmetric with decreased nasal labial fold on the right side.  Motor examination, strength equal bilaterally.  There is no drift.  Deep tendon reflexes +1 throughout.  Plantars downgoing.  Gait evaluation was deferred.  NEURO IMAGING:  A CT scan of the head which I personally reviewed shows an old posterior _______ capsular infarction.  No acute ischemia.  No hemorrhage.  IMPRESSION: 1. Stroke, vertebral basilar distribution. 2. Hypertension. 3. Diabetes mellitus.  The plan, recommendations, diagnosis, condition, and further intervention were discussed at length with the patient at the bedside.  Will admit the patient to the stroke unit for further workup and testing.  Management will consist of hemodynamic support, IV fluids, normal saline at 50 cc an hour, oxygen 2 L, and a heparin ischemic stroke protocol.  Will further obtain noninvasive imaging of the internal and external cranial vessel with MRI/MRA of the brain, and a 2-D echocardiogram for evaluation of embolic sources as the cause of  a stroke.  The patient will remain n.p.o. on bedrest.  Will obtain PT, OT consultation in the morning.  Dr. Nancee Liter was notified of the patients admission. DD:  01/13/00 TD:  01/13/00 Job: [id number]

## 2011-01-28 NOTE — Op Note (Signed)
[name], [name]                           ACCOUNT NO.:  [id number]   MEDICAL RECORD NO.:  [id number]                   PATIENT TYPE:  INP   LOCATION:  2907                                 FACILITY:  [facility]   PHYSICIAN:  Duke Salvia, M.D.               DATE OF BIRTH:  September 21, 1926   DATE OF PROCEDURE:  09/23/2003  DATE OF DISCHARGE:                                 OPERATIVE REPORT   PREOPERATIVE DIAGNOSIS:  Supraventricular tachycardia.   POSTOPERATIVE DIAGNOSIS:  AV nodal re-entry tachycardia.   OPERATION PERFORMED:  Invasive electrophysiologic study with isoproterenol  infusion, arrhythmia mapping, radio frequency energy catheter ablation with  repeat efficacy study.   SURGEON:  Duke Salvia, M.D.   DESCRIPTION OF PROCEDURE:  Following the obtaining of informed consent, the  patient was brought to the electrophysiology laboratory and placed on the  fluoroscopic table in supine position.  After routine prep and drape,  cardiac catheterization was performed with local anesthesia and conscious  sedation.  Noninvasive blood pressure monitoring, transcutaneous oxygen  saturation monitoring and end tidal CO2 monitoring were performed  continuously throughout the procedure.  Following the procedure, the  catheter was removed.  Hemostasis was obtained and the patient was  transferred to her room in stable condition.   CATHETERS:  A 5 French quadripolar catheter was inserted via the left  femoral vein to the AV junction to measure the His electrogram.  A 5 French quadripolar catheter was inserted via the left femoral vein to  the right ventricular apex.  A 5 French quadripolar catheter was inserted via the left femoral vein to  the high right atrium.  A 6 French octapolar catheter was inserted via the right femoral vein to the  coronary sinus and subsequently moved to the AV junction to measure the His  electrogram.  A 4 mm deflectable tip catheter was inserted via a Swartz  (SL2) sheath to  mapping sites in the posterior septal space.   Surface leads 1, aVF, and V1 were monitored continuously throughout the  procedure.  Following insertion of the catheters, the stimulation protocol  included incremental atrial pacing, incremental ventricular pacing, single  and double atrial extrastimuli, pace cycle length of 600 and 500 msec from  the high right atrium and the coronary sinus.  Burst atrial pacing.   RESULTS:  Surface electrocardiogram basic intervals.  __________  Rhythm is sinus initial, sinus final.  Cycle length is 818 msec initial, 610 msec final.  PR interval is 181 msec initial, 175 msec final.  QRS duration is 160 msec initial, 100 msec final.  QT interval is 421 msec initial, 340 msec final.  P wave duration is 160 msec, 128 msec final.  Bundle branch block is absent and absent.  Pre-excitation is absent and absent.  AH interval is 64 msec initial and 112 msec final.  HV interval is 53 msec initial and 40  msec final.  His bundle duration appeared to be normal initial and normal final.   AV NODAL FUNCTION:  AV Wenckebach cycle length is 315 msec in the absence of  isoproterenol and there was decremental conduction in the presence and in  the absence of isoproterenol with ventricular pacing.  AV nodal discontinuity was easily demonstrated with double atrial  extrastimuli.  Following ablation, no evidence of discontinuity could be  identified.   ACCESSORY PATHWAY FUNCTION:  No evidence of an accessory pathway was  identified.   VENTRICULAR RESPONSE TO PROGRAMMED STIMULATION:  Normal as for ventricular  stimulation as described above.   ARRHYTHMIAS INDUCED:  AV nodal re-entry tachycardia was difficultly and not  reproducibly inducible with atrial pacing at 380 msec.  The tachycardia  cycle length was 453 msec and during a typical episode of tachycardia, the  HA interval was 100 msec.  The AH interval was 343 msec.  VA time was 62  msec.  The  V high right atrial time was 90 msec.   Characteristics of the tachycardia included VA conduction was earliest in  His bundle electrogram.   Radio frequency energy.  A total of 10 applications of radio frequency  energy were applied with junctional rhythm seen on three of the 10  applications.  On none of them did the junctional rhythm last more than  approximately 5 to 10 seconds.  However, after the first significant  junctional rhythm, PR intervals were then shorter then RR intervals and  tachycardia was no longer inducible and no evidence of dual AV nodal  physiology was identified.  However, given the short burst, another  application of radio frequency energy was applied with junction rhythm used  as a surrogate end point.   FLUOROSCOPY TIME:  A total of approximately 15 minutes of fluoroscopy time  was utilized at 7.5 frames per second.   IMPRESSION:  1. Normal sinus function.  2. Normal atrial function.  3. Dual AV nodal physiology with inducible AV nodal re-entry tachycardia.     RF catheter ablation successfully interrupted antegrade slow pathway     function eliminating the substrate for the patient's arrhythmia     mechanism.  4. Normal His Purkinje system function.  5. No accessory pathway.  6. Normal ventricular response to programmed stimulation.   CONCLUSION:  The results of electrophysiologic testing confirmed AV nodal re-  entry tachycardia as the patient's clinical arrhythmia notwithstanding the  somewhat long RP interval noted on surface electrocardiogram.  It is  consistent with the patient's symptoms.  Slow pathway modification was  successfully accomplished notwithstanding significant difficulties in (a)  identification of the His electrogram ultimately requiring an octapolar  catheter and (b)  significant cephalad displacement of the coronary sinus os and (c) possibly because of the abnormal anatomy as suggested above, difficulty in getting  good  junctional rhythm.  However, no evidence of substrate for the patient's  arrhythmia was identified after the patient's procedure.                                               Duke Salvia, M.D.    SCK/MEDQ  D:  09/23/2003  T:  09/24/2003  Job:  [id number]   cc:   C. Ulyess Mort, M.D.  [address]  [place]  [place] [address]  Fax: [phone]

## 2011-01-28 NOTE — Consult Note (Signed)
[name], [name]                           ACCOUNT NO.:  [id number]   MEDICAL RECORD NO.:  [id number]                   PATIENT TYPE:  INP   LOCATION:  2907                                 FACILITY:  [facility]   PHYSICIAN:  Jesse Sans. Wall, M.D.                DATE OF BIRTH:  April 20, 1927   DATE OF CONSULTATION:  09/17/2003  DATE OF DISCHARGE:                                   CONSULTATION   REASON FOR CONSULTATION:  The patient is a very pleasant 77 year old female  with known coronary artery disease and multiple cardiac risk factors, who is  now seen in consultation for evaluation of recurrent supraventricular  tachycardia and abnormal cardiac enzymes.  The patient's cardiac history is notable for previous coronary angiogram in  March 2003, by Dr. Arturo Morton. Stuckey, revealing moderate diffuse coronary  artery disease with evidence of small vessel diabetic disease, with the  recommendation to treat medically.  Notable, there was an 80% apical diffuse  lesion in the LAD which was not amenable to percutaneous intervention.  Some  residual anatomy notable for a 70% ramus intermedius, a 60% obtuse marginal,  and a 70%-75% AV/circumflex.  The RCA (non-dominant), had no significant  disease.  The LV function at that time was preserved at 54%.  Prior to presentation the patient denied any symptoms suggestive of unstable  angina pectoris or congestive heart failure.  On the morning of admission,  she noted tachy palpitations and felt dizzy and lightheaded, but denied any  frank syncope.  She also had some shortness of breath, but denied any  associated chest pain or diaphoresis.  On presentation to the emergency room she was found in narrow complex  tachycardia at 140 beats per minute and was treated with 18 mg of adenosine  and 50 mg of Metoprolol.  She subsequently converted to a sinus bradycardia.  She did, however, have an 11-beat run of WCP earlier this morning.  This  was, however,  asymptomatic.  Serial cardiac markers are trending up, with a  current troponin I level of 0.6 and a peak MB of 10.7.  TSH is pending.  Additional notable labs include a potassium of 3.1 this morning.  Beta  natriuretic peptide was 329, with no evidence of congestive heart failure on  chest x-ray.   ALLERGIES:  No known drug allergies.   CURRENT MEDICATIONS:  1. Coated aspirin.  2. Plavix.  3. Lisinopril 40 mg daily.  4. Zocor 40 mg daily.  5. Metoprolol 25 mg q.8h.  6. Lasix 40 mg daily.  7. Intravenous heparin.  8. Sliding scale insulin.   PAST MEDICAL HISTORY:  1. Status post supraventricular tachycardia/abnormal cardiac markers in     September 2004.  2. Type 2 diabetes mellitus.  3. Hypertension.  4. History of hypokalemia.  5. History of a stroke/vertebral basilar insufficiency.  6. Hypercholesterolemia.  7. Gastroesophageal reflux disease.  8. A 2-D echocardiogram in September 2004:  Ejection fraction of 40%-50%,     mild diffuse LV HK, mild MR, several small pericardial effusions.   SOCIAL HISTORY:  The patient lives alone here in [place] but is quite  self-sufficient and does not need the assistance of a walker or cane.  She  has never smoked tobacco and does not drink alcohol.  She has nine children.   FAMILY HISTORY:  Father deceased at age 55, most likely secondary to a  myocardial infarction.   REVIEW OF SYSTEMS:  Negative for fever, productive cough, chills, sweats,  hemoptysis, hematochezia, melena, orthopnea, PND, tachy palpitations or  syncope.  The remaining systems are negative.   PHYSICAL EXAMINATION:  VITAL SIGNS:  Blood pressure 145/55, pulse 50,  regular, respirations 18, temperature 97.1 degrees.  SAO2 was 100% (room  air).  GENERAL:  A 77 year old female, in no apparent distress.  HEENT:  Normocephalic, atraumatic.  NECK:  Bilateral carotid pulses without bruits.  LUNGS:  Clear to auscultation in all fields.  HEART:  A regular rate and  rhythm, (S1, S2), a soft non-pathologic murmur in  upper LC.  ABDOMEN:  Soft, nontender with intact bowel sounds.  No bruits.  EXTREMITIES:  Diminished dorsalis pedis pulses with no pedal edema.  NEUROLOGIC:  No focal deficits.  Chest x-ray:  No active disease.  Electrocardiogram:  On admission revealed a supraventricular tachycardia of  140 beats per minute with 1-2 mm ST depression in lead II, aVF, V4-V6, 1 mm  ST depression in lead I, ST elevation in lead V1 and V2.   LABORATORY DATA:  WBC 7, hemoglobin 11.9, hematocrit 36, platelets 189.  Sodium 141, potassium 3.1, (3.3 on admission), BUN 15, creatinine 0.9,  glucose 155.  TSH pending.  Beta natriuretic peptide 329.   IMPRESSION:  1. Recurrent supraventricular tachycardia     a. Status post previous supraventricular tachycardia episodes in        September 2004, also associated with abnormal cardiac enzymes.  2. Non-ST elevation myocardial infarction     a. Most likely secondary to physiologic stress test supraventricular        tachycardia.  3. Ischemic cardiomyopathy     a. Status post coronary angiogram in March 2003; medical therapy        recommended.     b. An ejection fraction of 54% by cardiac catheterization (ejection        fraction 40%-50% by recent echocardiogram in September 2004).  4. Hypokalemia.  5. Type 2 diabetes mellitus.  6. Hypertension.  7. Hypercholesterolemia.   RECOMMENDATIONS:  The recommendation is to continue cycling the cardiac  enzymes, repeat potassium, and check a magnesium level.  TSH level is  currently pending.  We also will repeat a 2-D echocardiogram for  reassessment of the LV function and, if the ejection fraction is less than  or equal to 35%, refer the patient for  electrophysiology evaluation.  We will also schedule an adenosine Cardiolite  for the morning, to rule out ischemia.  Of note, if the patient has frequently-occurring SVT, then she will also be referred for an EP   evaluation, for consideration of ablation.     Gene Serpe, P.A. [facility]                      Thomas C. Wall, M.D.    GS/MEDQ  D:  09/17/2003  T:  09/17/2003  Job:  [id number]   cc:   [facility]

## 2011-01-28 NOTE — Discharge Summary (Signed)
[name], [name]                           ACCOUNT NO.:  [id number]   MEDICAL RECORD NO.:  [id number]                   PATIENT TYPE:  INP   LOCATION:  3309                                 FACILITY:  [facility]   PHYSICIAN:  Madaline Guthrie, M.D.                 DATE OF BIRTH:  1927/04/19   DATE OF ADMISSION:  06/07/2003  DATE OF DISCHARGE:  06/09/2003                                 DISCHARGE SUMMARY   DISCHARGE DIAGNOSES:  1. Supraventricular tachycardia.  2. Acute myocardial infarction.  3. Severe coronary artery disease.  4. Diabetes mellitus.  5. Hypertension.  6. Hypokalemia.  7. Cerebrovascular disease status post stroke in the past.  8. Gastritis.  9. Vertebrobasilar insufficiency.  10.      Diastolic heart failure.  11.      Hyperlipidemia.   DISCHARGE MEDICATIONS:  1. Toprol-XL 50 mg a day.  2. Aspirin 325 mg a day.  3. Prinivil 40 mg a day.  4. Xalatan one drop each daily.  5. Zocor 40 mg a day.  6. Plavix 75 mg a day.  7. Aciphex 20 mg a day.   The patient was specifically instructed to stop taking reserpine, Ditropan,  and hydrochlorothiazide.   FOLLOWUP APPOINTMENT:  The patient had appointment on October 18 at 10:00 in  the internal medicine outpatient clinic.   CONSULTATIONS:  None.   PROCEDURE:  Two-D echocardiogram was done on September 27.   HISTORY OF PRESENT ILLNESS:  The patient is a 77 year old African-American  female with a history of severe multivessel coronary artery disease which is  deemed inoperable. He was brought to the emergency room because of dizziness  and tachycardia. The patient reported that she had been in her usual state  of health prior to date of admission. She had woken up the morning of  admission at 6 a.m. and noticed that her heart rate was fast. She became  dizzy after walking around the house for a while and had to lay down. The  patient denied any vertigo, chest pain, nausea, vomiting, diaphoresis,  shortness of  breath, or palpitations. The patient also denied any fever or  chills recently.   PHYSICAL EXAMINATION:  VITAL SIGNS:  On exam, vital signs were with a  temperature of 98.7, pulse of 81 at the time that we examined her, blood  pressure 126/70, respiratory rate 20, oxygen saturation was  99% on 2  liters.  GENERAL:  IN general, the patient was a well-nourished female who appeared  stated age in no acute distress.  HEENT:  Eyes were nonicteric. Equal and reactive to light. ENT:  The patient  was missing some teeth but oropharynx was clear with moist mucous membranes.  NECK:  Supple without masses. Respirations were clear, symmetric.  CARDIOVASCULAR:  Regular rate and rhythm with no murmurs, rubs, or gallops.  ABDOMEN:  Positive bowel sounds in all four  quadrants. Soft, nontender.  EXTREMITIES:  Without edema without calf tenderness.  SKIN:  Dry and warm.  MUSCULOSKELETAL:  The patient had 4/5 bilaterally in the lower extremities  and 5/5 strength in the upper extremities. Strength was symmetric without  any focal defects.  NEUROLOGICAL:  Alert and oriented x4. Cranial nerves II-XII grossly intact.  PSYCHIATRIC EXAM:  Full affect, linear thought processes, good mood.   LABORATORY DATA:  BMET was with a sodium of 138, potassium of 3.3, chloride  104, bicarb 24, BUN 13, creatinine 0.7, glucose 181. CBC was with a white  blood cell count of 8.0, hemoglobin 12.3, platelets 202,000. Cardiac enzymes  in the ER with a CK of 57, CK-MB of 1.4, troponin I of 0.01. Chest x-ray:  There was no acute disease. EKG:  When she initially came into the ER was a  ventricular rate of 167 and revealed supraventricular tachycardia. Also  revealed a left axis deviation which was not knew, septal infarct which was  not new, and some marked ST abnormalities which again were not new as  compared with EKG from July 2002. At the time that we saw her, EKG revealed  a ventricular rate of 75 beats per minute with again  the septal infarct,  left axis deviation.   HOSPITAL COURSE:  1. Supraventricular tachycardia. The patient received three doses of     adenosine in the ER without any success in rate control or converting     supraventricular tachycardia. The patient was then placed on the Cardizem     drip in the ER with good rate control. The patient converted to normal     sinus rhythm before evaluated the patient. We admitted the patient to a     telemetry bed and started her on beta blockers for rate control. The     cause of SVT was examined including a TSH and magnesium which were within     normal limits and an 2-D echocardiogram which revealed no etiology for     the disease. The patient was taken off of her home Ditropan and reserpine     as these are noted to cause tachycardia and sometimes arrhythmias.     Cardiac enzymes were drawn serially. Cardiac enzymes did bump with a     troponin I of 0.45 and a CK-MB of 4.3 and CK of 76. The patient was     diagnosed with an acute MI.  2. Acute myocardial infarction. The patient was started on aspirin, heparin,     beta blockers, Zocor, and eventually Plavix. As patient was with known     coronary artery disease which was inoperable and previously asymptomatic,     she was monitored for 48 hours for any arrhythmias and was discharged to     home in good condition.  3. Coronary artery disease status post a catheterization in March of 2003     showing inoperable multivessel disease. The patient was managed medically     with medications as mentioned above. Plavix was added to her home regimen     of medications.  4. Diabetes mellitus. Metformin was held. During the hospitalization, the     patient was managed with sliding scale insulin without any complications.  5. Hypertension. Reserpine was discontinued, and patient was managed with a     beta blocker and lisinopril. Hydrochlorothiazide was stopped because    patient wished to be taken off of some  of her medications.  6. Hypokalemia. At  the time of admission, potassium was decreased at 3.3.     The patient was on hydrochlorothiazide at the time of admission, and the     hypokalemia was attributed to this. The patient was supplemented with     potassium and responded well to this. During the hospitalization, the     patient was taken off of hydrochlorothiazide, and so she was not     discharged on potassium for home.   DISCHARGE LABORATORY DATA:  At the time of discharge, the patient had a CBC  with a white blood cell count of 7.4, hemoglobin 12.1, hematocrit 36.1,  platelet count 201,000. A BMET was with a sodium of 142, potassium of 3.5,  chloride 103, CO2 of 31, glucose of 140, BUN 9, creatinine 0.8. LFTs were  all within normal limits. Last set of cardiac enzymes was with a CK of 67,  CK-MB of 1.3, troponin I of 0.14.   DISPOSITION:  The patient was discharged to home with family. The patient  has a followup appointment on October 18 at 10 a.m. in the internal medicine  outpatient clinic.   ISSUES AT FOLLOWUP:  The patient needs maximum medical management of  coronary artery disease, titration of beta blocker to maximum tolerated  dose, and careful monitoring of lipid status and hypertension.      Manning Charity, MD                        Madaline Guthrie, M.D.    KK/MEDQ  D:  07/29/2003  T:  07/29/2003  Job:  [id number]

## 2011-01-28 NOTE — Op Note (Signed)
[name], [name]                 ACCOUNT NO.:  [id number]   MEDICAL RECORD NO.:  [id number]          PATIENT TYPE:  AMB   LOCATION:  ENDO                         FACILITY:  [facility]   PHYSICIAN:  Graylin Shiver, M.D.   DATE OF BIRTH:  [birth year]   DATE OF PROCEDURE:  08/26/2004  DATE OF DISCHARGE:                                 OPERATIVE REPORT   PROCEDURE:  Esophagogastroduodenoscopy with biopsy for CLOtest.   INDICATION:  The patient complains of __________ and a lot of nausea and  intermittent vomiting.   Informed consent was obtained after explanation of the risks of bleeding,  infection, and perforation.   PREMEDICATIONS:  1.  Fentanyl 50 mcg IV.  2.  Versed 4 mg IV.   DESCRIPTION OF PROCEDURE:  With the patient in the left lateral decubitus  position, the Olympus gastroscope was inserted into the oropharynx and  passed into the esophagus.  It was advanced down the esophagus, then into  the stomach and into the duodenum.  The second portion and bulb of the  duodenum were normal.  The stomach showed some mild erythema compatible with  gastritis, and biopsy for CLOtest was obtained.  No lesions were seen in the  fundus or cardia.  The dissection looked normal in its entirety.  She  tolerated this procedure well without complication.   IMPRESSION:  Mild gastritis.   PLAN:  CLOtest will be checked.       SFG/MEDQ  D:  08/26/2004  T:  08/26/2004  Job:  [id number]   cc:   Fleet Contras, M.D.  [address]  [place]  [place] [address]  Fax: [phone]

## 2011-01-28 NOTE — Discharge Summary (Signed)
[place]. [facility]  Patient:    [name], [name] Visit Number: [id number] MRN: [id number]          Service Type: Attending:  C. Ulyess Mort, M.D. Dictated by:   Gertha Calkin, M.D. Adm. Date:  11/19/01 Disc. Date: 11/22/01   CC:         Everardo Beals. Juanda Chance, M.D. [facility]  Maisie Fus D. Riley Kill, M.D. [facility]   Discharge Summary  DATE OF BIRTH:  16-Nov-1926  DISCHARGE DIAGNOSES: 1. Angina. 2. Coronary artery disease. 3. Hypertension. 4. Hyperlipidemia. 5. Diabetes, type 2.  DISCHARGE MEDICATIONS: 1. Enteric-coated aspirin 325 mg p.o. q.d. 2. Hydrochlorothiazide 25 mg p.o. q.d. 3. Imdur 30 mg p.o. q.d. 4. K-Dur 20 mEq p.o. q.d. 5. Norvasc 5 mg p.o. q.d. 6. Prinivil 10 mg p.o. q.d. 7. Atenolol 50 mg p.o. q.d. 8. Zocor 20 mg p.o. q.d. 9. Nitrostat sublingual 0.4 mg p.o. q.29m. x 3.  FOLLOW-UP:  With Dr. ______, [facility].  The patient is to call for her clinic appointment.  CONSULTANTS:  [facility].  BRIEF HISTORY OF PRESENT ILLNESS:  A 77 year old African-American female with history of diabetes, hypertension, and reflux disease, who is a patient of Dr. Raylene Miyamoto, presents with chest pain x 3 days.  She described this pain as a dull ache and is unsure of the duration.  She states that it is under her left breast with no radiation and not related to exertion.  She denies nausea, vomiting, shortness of breath, fever, chills, or cough.  She does have some left arm pain; however, this is secondary to movement.  No localized tenderness.  She is unaware of her cardiac work-up in the past and during evaluation in the ED, had no chest pain.  Her risk factors include age, diabetes, hypertension, and hyperlipidemia.  2-D echocardiogram done November 2001, showed normal left ventricular ejection fraction.  She does have a positive family history as well with the father with MI at 46 and a sister who had MI at 73 years of  age.  PAST MEDICAL HISTORY: 1. Diabetes, most recent hemoglobin A1c down August 2002, was 7.2. 2. Hypertension. 3. Hyperlipidemia, total cholesterol of 268 and LDL of 173.  This was done in    August 2002. 4. Gastroesophageal reflux disease.  MEDICATIONS: 1. Glucophage 500 mg p.o. b.i.d. 2. Lisinopril 10 mg p.o. q.d. 3. Aciphex 20 mg p.o. q.d.  SOCIAL HISTORY:  Lives alone.  Has never smoked in her life.  No alcohol or drug use.  REVIEW OF SYSTEMS:  Negative, otherwise per HPI.  PHYSICAL EXAMINATION:  VITAL SIGNS:  Temperature 97.7, pulse 52, blood pressure 201/91 repeat was 180/90.  Respirations are 20 with 90% O2 saturations on room air.  GENERAL:  African-American female in no acute distress.  NECK:  Normal.  No JVD, no bruits.  CARDIOVASCULAR:  Bradycardic but regular rhythm and no local tenderness to palpation.  No murmurs, rubs, gallops.  RESPIRATORY:  Clear to auscultation.  No wheezing present.  Good breath sounds.  ABDOMEN:  Within normal limits.  Good bowel sounds.  EXTREMITIES:  No clubbing, cyanosis, or edema.  Pulses are present bilaterally and symmetric.  LABORATORY DATA:  A 12-lead EKG showed sinus bradycardia for 53 beats per minute, Q waves consistent with previous anterior infarct.  There was some mild ST-T wave finding in the lateral leads.  This was the only new change in comparison to an old EKG in July 2002.  Chest x-ray showed  cardiomegaly; however, no infiltrate and no active disease present.  BNP was normal.  CBC was normal.  PT was normal.  First set cardiac enzymes:  CK of 46, CK-MB of .8 with troponin I level of .05.  HOSPITAL COURSE:  A 77 year old female with multiple cardiac risk factors was admitted for angina and to be ruled out.  The patient did rule out with cardiac enzymes; however, given multiple risk factors, cardiology was consulted, and cardiac cath was performed by Dr. Riley Kill on November 21, 2001. This showed overall EF of  54%; however, some diffuse disease present.  Please see Dr. Louretta Shorten complete cath report.  It was deemed given that she was more than likely not to benefit from CABG, that medical treatment was prudent at this time.  She is already on a beta blocker, aspirin, nitrates, ACE inhibitor, calcium channel blocker as well as a diuretic.  Goal at this point is to control her hypertension with titration of her current medications.  She is discharged to follow up with her primary M.D., who is Dr. Juanda Chance. Dr. Delia Chimes office will contact patient with her follow-up appointment. Dictated by:   Gertha Calkin, M.D. Attending:  C. Ulyess Mort, M.D. DD:  03/24/02 TD:  03/26/02 Job: [id number]

## 2011-01-28 NOTE — Discharge Summary (Signed)
NAMEELIANNA, WINDOM                           ACCOUNT NO.:  [id number]   MEDICAL RECORD NO.:  [id number]                   PATIENT TYPE:  INP   LOCATION:  2907                                 FACILITY:  [facility]   PHYSICIAN:  C. Ulyess Mort, M.D.             DATE OF BIRTH:  Feb 14, 1927   DATE OF ADMISSION:  09/16/2003  DATE OF DISCHARGE:  09/24/2003                                 DISCHARGE SUMMARY   DISCHARGE DIAGNOSES:  1. Atrioventricular nodal reentry tachycardia, status post radial frequency     energy catheter ablation on September 23, 2003.  2. Abnormal cardiac enzymes secondary to arrhythmia/supraventricular     tachycardia.  Cardiolite negative on September 18, 2003.  3. Coronary artery disease.  Cardiac catheterization in March of 2003 with     ejection fraction 54, severe apical disease of the left anterior     descending, and 70-75% segmental lesion of the circumflex artery.  4. Diabetes mellitus, type 2.  Hemoglobin A1C 7.7 on September 17, 2003.  5. Hypokalemia.  6. Hypercapnia.  7. HDM  8. Anemia, normocytic, with hemoglobin 11.6.   DISCHARGE MEDICATIONS:  1. Aspirin 325 mg p.o. daily.  2. Plavix 75 mg p.o. daily.  3. Protonix 40 mg p.o. daily.  4. Lopressor 25 mg twice a day.  5. Lisinopril 40 mg p.o. daily.  6. Zocor 40 mg p.o. daily.  7. Glucotrol 5 mg p.o. daily.  8. HCTZ 25 mg p.o. daily.   DISPOSITION:  The patient was discharged home.  She has an appointment on  October 14, 2003, with Dr. Shon Baton in the outpatient clinic.  She has an  appointment on October 14, 2003, with Dr. Corinda Gubler.   CONSULTS:  Cardiologists, Duke Salvia, M.D., and Jesse Sans. Wall, M.D.   PROCEDURES:  1. Cardiolite stress test on September 18, 2003.  2. Electrophysiology study with radiofrequency energy catheter ablation on     September 23, 2003.   HISTORY OF PRESENT ILLNESS:  Mrs. Frankie is a 77 year old lady with known  cardiovascular disease, status post recent known ST elevated  myocardial  infarction with inoperable CAD, who was brought to the ED today for  lightheadedness.  She reports waking up as usual today and taking her  medication when she started feeling dizzy.  She did not have any chest pain,  but she was a little dyspneic.  She sat down and started to do her usual  activities, but found it difficult.  Her daughter called her and advised her  to call 911.   PHYSICAL EXAMINATION:  VITAL SIGNS:  Pulse 140, blood pressure 122/81,  temperature 97 degrees, respiratory rate 14, oxygen saturation 100 on 2 L.  GENERAL APPEARANCE:  She was in no acute distress.  _________  HEENT:  Eyes:  PERRLA.  EOMs were intact.  ENT:  Missing frontal incisors.  NECK:  No JVD.  No carotid bruits.  RESPIRATORY:  Coarse breath sounds in bases.  No wheezes.  No rales.  CARDIOVASCULAR:  Regular rate and rhythm.  Tachycardic.  GASTROINTESTINAL:  Soft, obese, and nontender.  Bowel sounds present.  EXTREMITIES:  1+ pedal edema.  Decreased pulses bilaterally on anterior  dorsalis pedis.  SKIN:  Cold and dry.  NEUROLOGIC:  Grossly intact.   LABORATORIES ON ADMISSION:  White blood cell count 6.9, hemoglobin 12.6,  platelets 217, MCV 89.  The pH was 7.49 and pCO2 52.6.  BNP 329.  Sodium  138, potassium 3.3, chloride 105, CO2 25, BUN 50, creatinine 0.9, blood  glucose 144.  Anion gap 8.  CK-MB 3.6 and 5.5, troponin 0.5 and 2.11, and  myoglobin 73.7 and 81.2.   HOSPITAL COURSE:  Problem #1 - ATRIOVENTRICULAR NODAL REENTRANT TACHYCARDIA:  The patient was admitted with evidence of supraventricular tachycardia on  EKG.  Her cardiac enzymes were elevated.  The differential diagnosis  included non-ST elevation myocardial infarction versus physiologic stress  test (supraventricular tachycardia).  The patient went to Cardiolite stress  test which showed no evidence for ischemia.  During her hospital stay, the  patient had a onetime run of nonsustained ventricular tachycardia which was   attributed to electrolyte imbalance, such as hypokalemia.  The patient was  repleted with potassium chloride.  The patient was seen by Dr. Daleen Squibb and Dr.  Graciela Husbands.  On September 23, 2003, she was taken for elective electrophysiology  studies by Dr. Graciela Husbands and radiofrequency ablation was performed.  The patient  was discharged in stable condition and sinus rhythm.   Problem #2 - ABNORMAL CARDIAC ENZYMES:  This problem was thought secondary  to arrhythmias, such as supraventricular tachycardia as a stress for  myocardium.  Cardiolite was done on September 18, 2003, and was negative.   Problem #3 - DIABETES MELLITUS, TYPE 2:  On admission, the hemoglobin A1C  was 7.7.  The patient was started Lantus insulin with regular insulin before  meals.  Then she was started on Glucotrol home dose and 2.5 mg and increased  to 5 mg p.o. daily.  The patient was discharged on Glucotrol 5 mg p.o.  daily.  Hypokalemia was probably secondary diuretics and the patient was  repleted with potassium chloride.   Problem #4 - HYPERLIPIDEMIA:  The patient was on Zocor 40 mg p.o. daily.   LABORATORIES ON DISCHARGE:  White blood cell count 7.7, red blood cell count  3.725, hemoglobin 11.2, hematocrit 53.5, MCV 89.3, RDW 13.8, and platelets  171.  Sodium 142, potassium 4.1, chloride 110, CO2 25, glucose 127, BUN 10,  creatinine 0.8, calcium 9.8.  Iron 84, folate 590, ferritin 197, B12 518,  magnesium 2.4.  TSH 1.189.      Mont Dutton, MD                           Gary Fleet, M.D.    Wallace Keller  D:  10/17/2003  T:  10/19/2003  Job:  [id number]

## 2011-01-28 NOTE — Op Note (Signed)
[place]  Patient:    [name], [name] Visit Number: [id number] MRN: [id number]          Service Type: MED Location: [phone] Attending Physician:  Edwyna Perfect Dictated by:   Arturo Morton Riley Kill, M.D. [facility] Proc. Date: 11/21/01 Admit Date:  11/19/2001   CC:         Bruce Donath, M.D.  Bruce Elvera Lennox Juanda Chance, M.D. [facility]  CV Laboratory  C. Ulyess Mort, M.D.   Operative Report  INDICATIONS: The patient is a delightful 77 year old female, with a history of both diabetes and hypertension. She also has hyperlipidemia. She lives alone and does not smoke cigarettes. She recently presented with some chest pain and the chest pain was worrisome for coronary ischemia. Her cardiac markers were negative. There is an old anteroseptal MI by ECG.  PROCEDURES: 1. Left heart catheterization. 2. Selective coronary arteriography. 3. Selective left ventriculography.  DESCRIPTION OF PROCEDURE: The procedure was performed from the right femoral artery using 6 French catheters.  She tolerated the procedure well. At completion of procedure we did give her some labetalol. We also gave her some nitroglycerin during the course of the procedure.  HEMODYNAMIC DATA: 1. The central aortic pressure 189/85. 2. Left ventricular pressure 185/25. 3. No aortic left ventricular gradient on pullback across the aortic valve.  ANGIOGRAPHIC DATA: 1. Ventriculography performed in the RAO projection reveals very mild global    hypokinesis, possibly related to long-standing hypertension. Ejection    fraction was calculated at 54%. I did not see what appeared to be    significant mitral regurgitation. 2. The left main coronary artery was a large caliber vessel being about    6 mm in size and without significant focal narrowing. 3. The left anterior descending artery demonstrates perhaps 20% proximal    narrowing. There is a septal perforator that has about 70% narrowing.    The  LAD beyond this point is relatively smooth down to the apical tip where    there is severe apical disease of diffuse 80% narrowing and a bifurcating    vessel distally, which has a typical diabetic appearance. This suggest    diffuse diabetic disease. There is a optional diagonal or ramus intermedius    that has 70% proximal narrowing. There is a diagonal that is small in    caliber and has about 90% ostial narrowing. There is a third diagonal    which has about 70% proximal narrowing. There is a forth diagonal without    significant disease. 4. The circumflex proper is a dominant vessel. There was about 30% ostial    segmental narrowing. The vessel opens up and provides a large marginal    branch which bifurcates and has about 50% narrowing in the superior    sub-branch. The AV circumflex then terminates as a posterior descending    branch. The AV circumflex has about 70-75% narrowing after the origin of    the OM. 5. The right coronary artery is a nondominant vessel with some luminal    irregularity but no high-grade areas of focal disease.  CONCLUSIONS: 1. Preserved overall left ventricular function. 2. Severe apical disease of the LAD with also involvement of some of the    diagonal branches which represent small diffusely diseased diabetic    vessels. 3. There is 70-75% segmental focal segmental lesion of the arteriovenous    circumflex supplying the posterior descending artery system, which is    amenable to percutaneous  intervention, but unlikely the current cause of    chest pain.  DISPOSITION: At the present time, my leaning would be in the direction of medical therapy. Her severe LAD disease at the apex is not amenable either to percutaneous intervention nor is it amenable to revascularization surgery. The only amenable lesion really is in the area of the AV circumflex, and my leaning would be in the direction of medical therapy for this given her diabetes and the lack of  drug-eluding stents at the present time. She will be followed in cardiology as well as general medicine clinic. Dictated by:   Arturo Morton Riley Kill, M.D. [facility] Attending Physician:  Edwyna Perfect DD:  11/21/01 TD:  11/22/01 Job: [id number]

## 2011-01-28 NOTE — Op Note (Signed)
[name], [name]                 ACCOUNT NO.:  [id number]   MEDICAL RECORD NO.:  [id number]          PATIENT TYPE:  AMB   LOCATION:  ENDO                         FACILITY:  [facility]   PHYSICIAN:  Graylin Shiver, M.D.   DATE OF BIRTH:  1927/03/01   DATE OF PROCEDURE:  08/26/2004  DATE OF DISCHARGE:                                 OPERATIVE REPORT   PROCEDURE:  Colonoscopy to the hepatic flexure.   INDICATION FOR PROCEDURE:  History of weight loss, screening.   Informed consent was obtained after explanation of the risks of bleeding,  infection, and perforation.   PREMEDICATIONS:  The procedure was done after an EGD with an additional 50  mcg of Fentanyl and 4 mg of Versed given.   DESCRIPTION OF PROCEDURE:  With the patient in the left lateral decubitus  position, a rectal exam was performed; no masses were felt.  The Olympus  colonoscope was inserted into the rectum and advanced around a very tortuous  colon to the region of the hepatic flexure.  Despite all maneuvers, I could  not advance the scope beyond this point.  I applied pressure to various  points on the abdomen as well as turned the patient on her back and right  side.  The transverse colon looked normal.  The descending colon and sigmoid  showed diverticulosis.  The rectum was normal.  She tolerated the procedure  well without complications.   IMPRESSION:  Diverticulosis of the left colon.   PLAN:  Obtain a barium enema to evaluate the proximal colon.       SFG/MEDQ  D:  08/26/2004  T:  08/26/2004  Job:  [id number]   cc:   Fleet Contras, M.D.  [address]  [place]  [place] [address]  Fax: [phone]

## 2011-06-06 LAB — DIFFERENTIAL
Basophils Absolute: 0
Basophils Relative: 1
Eosinophils Relative: 2
Monocytes Absolute: 0.5

## 2011-06-06 LAB — COMPREHENSIVE METABOLIC PANEL
ALT: <8
AST: 16
Albumin: 3.5
Alkaline Phosphatase: 49
Chloride: 101
GFR calc Af Amer: >60
Potassium: 3.6
Total Bilirubin: 0.7

## 2011-06-06 LAB — URINALYSIS, ROUTINE W REFLEX MICROSCOPIC
Bilirubin Urine: NEGATIVE
Ketones, ur: NEGATIVE
Nitrite: NEGATIVE
Urobilinogen, UA: 1
pH: 6

## 2011-06-06 LAB — CBC
Platelets: 189
WBC: 6.9

## 2011-06-06 LAB — URINE CULTURE: Colony Count: >=100000

## 2011-06-06 LAB — URINE MICROSCOPIC-ADD ON

## 2011-06-21 LAB — DIFFERENTIAL
Basophils Relative: 0
Lymphocytes Relative: 17
Lymphs Abs: 0.8
Monocytes Relative: 4
Neutro Abs: 3.7
Neutrophils Relative %: 78 — ABNORMAL HIGH

## 2011-06-21 LAB — COMPREHENSIVE METABOLIC PANEL
AST: 16
Albumin: 1.7 — ABNORMAL LOW
Albumin: 3.3 — ABNORMAL LOW
Alkaline Phosphatase: 19 — ABNORMAL LOW
BUN: 8
Calcium: 4.5 — CL
Calcium: 9.2
Creatinine, Ser: 0.34 — ABNORMAL LOW
Creatinine, Ser: 0.79
GFR calc Af Amer: >60
Glucose, Bld: 94
Total Protein: 3.1 — ABNORMAL LOW
Total Protein: 6

## 2011-06-21 LAB — CBC
HCT: 22.3 — ABNORMAL LOW
Hemoglobin: 13.1
Hemoglobin: 7.4 — CL
MCHC: 33.2
MCHC: 33.3
MCHC: 33.5
MCV: 91.4
MCV: 91.5
MCV: 91.8
Platelets: 108 — ABNORMAL LOW
Platelets: 158
RBC: 4.3
RDW: 15.4 — ABNORMAL HIGH
WBC: 7

## 2011-06-21 LAB — URINALYSIS, ROUTINE W REFLEX MICROSCOPIC
Bilirubin Urine: NEGATIVE
Hgb urine dipstick: NEGATIVE
Ketones, ur: 15 — AB
Nitrite: NEGATIVE
pH: 7.5

## 2011-06-21 LAB — BASIC METABOLIC PANEL
CO2: 33 — ABNORMAL HIGH
Chloride: 95 — ABNORMAL LOW
Creatinine, Ser: 0.82
GFR calc Af Amer: >60
Glucose, Bld: 159 — ABNORMAL HIGH

## 2011-06-21 LAB — POCT CARDIAC MARKERS
CKMB, poc: <1 — ABNORMAL LOW
Myoglobin, poc: 15.3
Operator id: 3206

## 2011-06-21 LAB — ABO/RH: ABO/RH(D): O POS

## 2011-06-21 LAB — URINE MICROSCOPIC-ADD ON

## 2012-07-25 ENCOUNTER — Ambulatory Visit (HOSPITAL_COMMUNITY)
Admission: RE | Admit: 2012-07-25 | Discharge: 2012-07-25 | Disposition: A | Discharge status: Home / Self Care | Payer: Medicare Other | Source: Ambulatory Visit

## 2012-07-25 DIAGNOSIS — R1312 Dysphagia, oropharyngeal phase: Secondary | ICD-10-CM | POA: Insufficient documentation

## 2012-07-25 DIAGNOSIS — R1311 Dysphagia, oral phase: Secondary | ICD-10-CM | POA: Insufficient documentation

## 2012-07-25 DIAGNOSIS — R1310 Dysphagia, unspecified: Secondary | ICD-10-CM

## 2012-07-25 DIAGNOSIS — R1313 Dysphagia, pharyngeal phase: Secondary | ICD-10-CM | POA: Insufficient documentation

## 2012-07-25 NOTE — Procedures (Signed)
Objective Swallowing Evaluation: Modified Barium Swallowing Study  Patient Details  Name: [name] MRN: [id number] Date of Birth: 1927-07-29  Today's Date: 07/25/2012 Time: 4098-1191 SLP Time Calculation (min): 30 min  Past Medical History: No past medical history on file. Past Surgical History: No past surgical history on file. HPI:  78 y/o female sent from SNF for OP MBS due to concerns for aspiration. Patient unable to provide specifics on swallowing difficulty. PMH of GERD, HTN, DMII, hypotension, hyperlipidemia, dementia, and CVA. OME WFL however patient noted to be carrying a washcloth and wiping mouth periodically raising concern for decreased saliva management.      Assessment / Plan / Recommendation Clinical Impression  Dysphagia Diagnosis: Mild oral phase dysphagia;Mild pharyngeal phase dysphagia Clinical impression: Patient presents with a mild oropharyngeal dysphagia characterized by delayed oral transit of soft solids and intermittent tongue pumping followed by a consistently delayed swallow initiation (to vallecula with pureed solids, pyriform sinuses with thin and mechanical soft solids) but with full airway protection. No aspiration or penetration observed. Patient may wish to consider downgrade to mechanical soft to decreased depth of swallow initiation delay and consume pills whole in puree to decrease risk of aspiration with mixed consistencies.     Treatment Recommendation  Defer treatment plan to SLP at (Comment) (SNF)    Diet Recommendation Dysphagia 3 (Mechanical Soft);Thin liquid   Liquid Administration via: Cup;Straw Medication Administration: Whole meds with puree Supervision: Patient able to self feed;Intermittent supervision to cue for compensatory strategies Compensations: Slow rate;Small sips/bites Postural Changes and/or Swallow Maneuvers: Seated upright 90 degrees    Other  Recommendations Oral Care Recommendations: Oral care BID   Follow Up  Recommendations  Skilled Nursing facility           SLP Swallow Goals     General HPI: 78 y/o female sent from SNF for OP MBS due to concerns for aspiration. Patient unable to provide specifics on swallowing difficulty. PMH of GERD, HTN, DMII, hypotension, hyperlipidemia, dementia, and CVA. OME WFL however patient noted to be carrying a washcloth and wiping mouth periodically raising concern for decreased saliva management.  Type of Study: Modified Barium Swallowing Study Reason for Referral: Objectively evaluate swallowing function Previous Swallow Assessment: none noted.  Diet Prior to this Study: Regular;Thin liquids Temperature Spikes Noted:  (unknown) Respiratory Status: Room air History of Recent Intubation: No Behavior/Cognition: Alert;Cooperative;Pleasant mood;Confused Oral Cavity - Dentition: Missing dentition (missing top dentition, adequate bottom dentition) Oral Motor / Sensory Function: Within functional limits Self-Feeding Abilities: Able to feed self Patient Positioning: Upright in chair Baseline Vocal Quality: Clear Volitional Cough: Strong Volitional Swallow: Able to elicit Anatomy: Within functional limits Pharyngeal Secretions: Not observed secondary MBS    Reason for Referral Objectively evaluate swallowing function   Oral Phase Oral Preparation/Oral Phase Oral Phase: Impaired Oral - Thin Oral - Thin Cup: Lingual pumping;Delayed oral transit Oral - Thin Straw: Lingual pumping;Delayed oral transit Oral - Solids Oral - Puree: Within functional limits Oral - Mechanical Soft: Delayed oral transit Oral - Pill: Delayed oral transit   Pharyngeal Phase Pharyngeal Phase Pharyngeal Phase: Impaired Pharyngeal - Thin Pharyngeal - Thin Cup: Delayed swallow initiation;Premature spillage to pyriform sinuses Pharyngeal - Thin Straw: Delayed swallow initiation;Premature spillage to pyriform sinuses Pharyngeal - Solids Pharyngeal - Puree: Delayed swallow  initiation;Premature spillage to valleculae Pharyngeal - Mechanical Soft: Delayed swallow initiation;Premature spillage to pyriform sinuses Pharyngeal - Pill: Delayed swallow initiation;Premature spillage to valleculae  Cervical Esophageal Phase    GO  Cervical Esophageal Phase Cervical Esophageal Phase: [facility]    Functional Assessment Tool Used: skilled clinical judgement Functional Limitations: Swallowing Swallow Current Status (R6045): At least 1 percent but less than 20 percent impaired, limited or restricted Swallow Goal Status [phone]): At least 1 percent but less than 20 percent impaired, limited or restricted Swallow Discharge Status [phone]): At least 1 percent but less than 20 percent impaired, limited or restricted   [facility] MA, CCC-SLP [phone]  Ferdinand Lango Meryl 07/25/2012, 11:54 AM

## 2012-12-17 ENCOUNTER — Non-Acute Institutional Stay (SKILLED_NURSING_FACILITY): Payer: Medicare Other | Admitting: Internal Medicine

## 2012-12-17 DIAGNOSIS — I2510 Atherosclerotic heart disease of native coronary artery without angina pectoris: Secondary | ICD-10-CM

## 2012-12-17 DIAGNOSIS — I10 Essential (primary) hypertension: Secondary | ICD-10-CM

## 2012-12-17 DIAGNOSIS — E7800 Pure hypercholesterolemia, unspecified: Secondary | ICD-10-CM

## 2012-12-17 DIAGNOSIS — E1159 Type 2 diabetes mellitus with other circulatory complications: Secondary | ICD-10-CM

## 2012-12-19 NOTE — Progress Notes (Signed)
PROGRESS NOTE  DATE: 12-17-12  FACILITY: [facility]  LEVEL OF CARE: SNF  Routine Visit  CHIEF COMPLAINT:  Manage diabetes mellitus and coronary artery disease  HISTORY OF PRESENT ILLNESS:  REASSESSMENT OF ONGOING PROBLEM(S): 1.DM:pt's DM remains stable.  Pt denies polyuria, polydipsia, polyphagia, changes in vision or hypoglycemic episodes.  No complications noted from the medication presently being used.  Last hemoglobin A1c is: 8.1 in 2/14. 2. CAD: The angina has been stable. The patient denies dyspnea on exertion, orthopnea, pedal edema, palpitations and paroxysmal nocturnal dyspnea. No complications noted from the medication presently being used.  PAST MEDICAL HISTORY : Reviewed.  No changes.  CURRENT MEDICATIONS: Reviewed per [facility]  REVIEW OF SYSTEMS:  GENERAL: no change in appetite, no fatigue, no weight changes, no fever, chills or weakness RESPIRATORY: no cough, SOB, DOE, wheezing, hemoptysis CARDIAC: no chest pain, edema or palpitations GI: no abdominal pain, diarrhea, constipation, heart burn, nausea or vomiting  PHYSICAL EXAMINATION  VS:  T 96.7       P 53      RR 20      BP 136/84     POX %     WT (Lb) 172  GENERAL: no acute distress, moderately obese body habitus NECK: supple, trachea midline, no neck masses, no thyroid tenderness, no thyromegaly RESPIRATORY: breathing is even & unlabored, BS CTAB CARDIAC: RRR, no murmur,no extra heart sounds, no edema GI: abdomen soft, normal BS, no masses, no tenderness, no hepatomegaly, no splenomegaly PSYCHIATRIC: the patient is alert & oriented to person, affect & behavior appropriate  LABS/RADIOLOGY: 3/14 hemoglobin 11.1, hemoglobin 10.6 MCV 89 otherwise CBC normal 11/13 creatinine 1.02 otherwise BMP normal 10/13 alkaline phosphatase 111 otherwise CMP normal, total cholesterol 200, LDL 113 triglycerides 69 HDL 73  ASSESSMENT/PLAN: 1. diabetes mellitus-stable. 2. CAD-stable. 3. hypertension-well-controlled. 4.  hyperlipidemia-check fasting lipid panel. 5. dementia-stable. 6. GERD-well-controlled.  CPT CODE: 16109

## 2012-12-31 ENCOUNTER — Non-Acute Institutional Stay (SKILLED_NURSING_FACILITY): Payer: Medicare Other | Admitting: Internal Medicine

## 2012-12-31 DIAGNOSIS — E119 Type 2 diabetes mellitus without complications: Secondary | ICD-10-CM

## 2012-12-31 DIAGNOSIS — N179 Acute kidney failure, unspecified: Secondary | ICD-10-CM

## 2013-01-07 ENCOUNTER — Non-Acute Institutional Stay (SKILLED_NURSING_FACILITY): Payer: Medicare Other | Admitting: Internal Medicine

## 2013-01-07 DIAGNOSIS — N179 Acute kidney failure, unspecified: Secondary | ICD-10-CM

## 2013-01-10 NOTE — Progress Notes (Signed)
Patient ID: [name], female   DOB: Sep 03, 1927, 79 y.o.   MRN: [id number]        PROGRESS NOTE  DATE:  12/31/2012   FACILITY: [facility]   LEVEL OF CARE: SNF  Acute Visit  CHIEF COMPLAINT:  Manage acute renal failure and diabetes mellitus.    HISTORY OF PRESENT ILLNESS: I was requested by the staff to assess the patient regarding above problem(s):  ACUTE RENAL FAILURE:   On 12/20/2012, BUN was 21, creatinine 1.14.  In 11/2012, BUN was 22, creatinine 1.  Patient denies increasing confusion or lower extremity swelling.    DM:pt's DM remains stable.  Pt denies polyuria, polydipsia, polyphagia, changes in vision or hypoglycemic episodes.  No complications noted from the medication presently being used.  Last hemoglobin A1c is:  On 12/20/2012,  hemoglobin A1C was 7.  PAST MEDICAL HISTORY : Reviewed.  No changes.  CURRENT MEDICATIONS: Reviewed per [facility]  REVIEW OF SYSTEMS:  GENERAL: no change in appetite, no fatigue, no weight changes, no fever, chills or weakness RESPIRATORY: no cough, SOB, DOE,, wheezing, hemoptysis CARDIAC: no chest pain, edema or palpitations GI: no abdominal pain, diarrhea, constipation, heart burn, nausea or vomiting  PHYSICAL EXAMINATION  GENERAL: no acute distress, moderately obese body habitus EYES: conjunctivae normal, sclerae normal, normal eye lids NECK: supple, trachea midline, no neck masses, no thyroid tenderness, no thyromegaly LYMPHATICS: no LAN in the neck, no supraclavicular LAN RESPIRATORY: breathing is even & unlabored, BS CTAB CARDIAC: RRR, no murmur,no extra heart sounds EDEMA/VARICOSITIES:  +1 bilateral lower extremity edema  ARTERIAL:  pedal pulses nonpalpable  GI: abdomen soft, normal BS, no masses, no tenderness, no hepatomegaly, no splenomegaly PSYCHIATRIC: the patient is alert & oriented to person, affect & behavior appropriate  ASSESSMENT/PLAN:  Acute renal failure.  New problem.  Patient is on lisinopril.  Therefore, we will  reassess.   Diabetes mellitus.  I think this is adequate control.  We will monitor.    CPT CODE: 86578

## 2013-01-14 ENCOUNTER — Non-Acute Institutional Stay (SKILLED_NURSING_FACILITY): Payer: Medicare Other | Admitting: Internal Medicine

## 2013-01-14 DIAGNOSIS — E1059 Type 1 diabetes mellitus with other circulatory complications: Secondary | ICD-10-CM

## 2013-01-14 DIAGNOSIS — I10 Essential (primary) hypertension: Secondary | ICD-10-CM

## 2013-01-14 DIAGNOSIS — I2510 Atherosclerotic heart disease of native coronary artery without angina pectoris: Secondary | ICD-10-CM

## 2013-01-14 DIAGNOSIS — E7800 Pure hypercholesterolemia, unspecified: Secondary | ICD-10-CM

## 2013-01-18 NOTE — Progress Notes (Signed)
PROGRESS NOTE  DATE: 01-14-13  FACILITY: [facility]  LEVEL OF CARE: SNF  Routine Visit  CHIEF COMPLAINT:  Manage diabetes mellitus and coronary artery disease  HISTORY OF PRESENT ILLNESS:  REASSESSMENT OF ONGOING PROBLEM(S):  1.DM:pt's DM remains stable.  Pt denies polyuria, polydipsia, polyphagia, changes in vision or hypoglycemic episodes.  No complications noted from the medication presently being used.  Last hemoglobin A1c is: 7 in 4/14, 8.1 in 2/14.  2. CAD: The angina has been stable. The patient denies dyspnea on exertion, orthopnea, pedal edema, palpitations and paroxysmal nocturnal dyspnea. No complications noted from the medication presently being used.  PAST MEDICAL HISTORY : Reviewed.  No changes.  CURRENT MEDICATIONS: Reviewed per [facility]  REVIEW OF SYSTEMS:  GENERAL: no change in appetite, no fatigue, no weight changes, no fever, chills or weakness RESPIRATORY: no cough, SOB, DOE, wheezing, hemoptysis CARDIAC: no chest pain, edema or palpitations GI: no abdominal pain, diarrhea, constipation, heart burn, nausea or vomiting  PHYSICAL EXAMINATION  VS:  T 99.1       P 54      RR 20      BP 128/70     POX %     WT (Lb) 171  GENERAL: no acute distress, moderately obese body habitus NECK: supple, trachea midline, no neck masses, no thyroid tenderness, no thyromegaly RESPIRATORY: breathing is even & unlabored, BS CTAB CARDIAC: RRR, no murmur,no extra heart sounds, no edema GI: abdomen soft, normal BS, no masses, no tenderness, no hepatomegaly, no splenomegaly PSYCHIATRIC: the patient is alert & oriented to person, affect & behavior appropriate  LABS/RADIOLOGY: 4/14 glucose 112, creatinine 1.3 otherwise BMP normal, albumin 3.4 otherwise liver profile normal, fasting lipid panel normal 3/14 hemoglobin 11.1, hemoglobin 10.6 MCV 89 otherwise CBC normal 11/13 creatinine 1.02 otherwise BMP normal 10/13 alkaline phosphatase 111 otherwise CMP normal, total cholesterol  200, LDL 113 triglycerides 69 HDL 73  ASSESSMENT/PLAN: 1. diabetes mellitus-stable. 2. CAD-stable. 3. hypertension-well-controlled. 4. hyperlipidemia-well controlled. 5. dementia-stable. 6. GERD-well-controlled.  CPT CODE: 16109

## 2013-01-21 ENCOUNTER — Inpatient Hospital Stay (HOSPITAL_COMMUNITY)
Admission: EM | Admit: 2013-01-21 | Discharge: 2013-01-23 | DRG: 065 | Disposition: A | Discharge status: Skilled Nursing Facility | Payer: Medicare Other | Attending: Internal Medicine | Admitting: Internal Medicine

## 2013-01-21 ENCOUNTER — Encounter (HOSPITAL_COMMUNITY): Payer: Self-pay | Admitting: Family Medicine

## 2013-01-21 ENCOUNTER — Emergency Department (HOSPITAL_COMMUNITY): Payer: Medicare Other

## 2013-01-21 DIAGNOSIS — Z7982 Long term (current) use of aspirin: Secondary | ICD-10-CM

## 2013-01-21 DIAGNOSIS — R4701 Aphasia: Secondary | ICD-10-CM | POA: Diagnosis present

## 2013-01-21 DIAGNOSIS — R29810 Facial weakness: Secondary | ICD-10-CM | POA: Diagnosis present

## 2013-01-21 DIAGNOSIS — E785 Hyperlipidemia, unspecified: Secondary | ICD-10-CM | POA: Diagnosis present

## 2013-01-21 DIAGNOSIS — I6350 Cerebral infarction due to unspecified occlusion or stenosis of unspecified cerebral artery: Principal | ICD-10-CM | POA: Diagnosis present

## 2013-01-21 DIAGNOSIS — N390 Urinary tract infection, site not specified: Secondary | ICD-10-CM | POA: Diagnosis present

## 2013-01-21 DIAGNOSIS — M129 Arthropathy, unspecified: Secondary | ICD-10-CM | POA: Diagnosis present

## 2013-01-21 DIAGNOSIS — R3981 Functional urinary incontinence: Secondary | ICD-10-CM | POA: Diagnosis present

## 2013-01-21 DIAGNOSIS — M1990 Unspecified osteoarthritis, unspecified site: Secondary | ICD-10-CM | POA: Diagnosis present

## 2013-01-21 DIAGNOSIS — E119 Type 2 diabetes mellitus without complications: Secondary | ICD-10-CM | POA: Diagnosis present

## 2013-01-21 DIAGNOSIS — I6789 Other cerebrovascular disease: Secondary | ICD-10-CM | POA: Diagnosis present

## 2013-01-21 DIAGNOSIS — I129 Hypertensive chronic kidney disease with stage 1 through stage 4 chronic kidney disease, or unspecified chronic kidney disease: Secondary | ICD-10-CM | POA: Diagnosis present

## 2013-01-21 DIAGNOSIS — N189 Chronic kidney disease, unspecified: Secondary | ICD-10-CM | POA: Diagnosis present

## 2013-01-21 DIAGNOSIS — G8190 Hemiplegia, unspecified affecting unspecified side: Secondary | ICD-10-CM | POA: Diagnosis present

## 2013-01-21 DIAGNOSIS — F0280 Dementia in other diseases classified elsewhere without behavioral disturbance: Secondary | ICD-10-CM | POA: Diagnosis present

## 2013-01-21 DIAGNOSIS — Z79899 Other long term (current) drug therapy: Secondary | ICD-10-CM

## 2013-01-21 DIAGNOSIS — E7800 Pure hypercholesterolemia, unspecified: Secondary | ICD-10-CM | POA: Diagnosis present

## 2013-01-21 DIAGNOSIS — Z8673 Personal history of transient ischemic attack (TIA), and cerebral infarction without residual deficits: Secondary | ICD-10-CM

## 2013-01-21 DIAGNOSIS — I10 Essential (primary) hypertension: Secondary | ICD-10-CM | POA: Diagnosis present

## 2013-01-21 DIAGNOSIS — A498 Other bacterial infections of unspecified site: Secondary | ICD-10-CM | POA: Diagnosis present

## 2013-01-21 DIAGNOSIS — E1110 Type 2 diabetes mellitus with ketoacidosis without coma: Secondary | ICD-10-CM

## 2013-01-21 DIAGNOSIS — R1310 Dysphagia, unspecified: Secondary | ICD-10-CM | POA: Diagnosis present

## 2013-01-21 DIAGNOSIS — I639 Cerebral infarction, unspecified: Secondary | ICD-10-CM | POA: Diagnosis present

## 2013-01-21 DIAGNOSIS — Z794 Long term (current) use of insulin: Secondary | ICD-10-CM

## 2013-01-21 DIAGNOSIS — G309 Alzheimer's disease, unspecified: Secondary | ICD-10-CM | POA: Diagnosis present

## 2013-01-21 DIAGNOSIS — R159 Full incontinence of feces: Secondary | ICD-10-CM | POA: Diagnosis present

## 2013-01-21 HISTORY — DX: Essential (primary) hypertension: I10

## 2013-01-21 HISTORY — DX: Dementia in other diseases classified elsewhere, unspecified severity, without behavioral disturbance, psychotic disturbance, mood disturbance, and anxiety: F02.80

## 2013-01-21 HISTORY — DX: Alzheimer's disease, unspecified: G30.9

## 2013-01-21 HISTORY — DX: Cerebrovascular disease, unspecified: I67.9

## 2013-01-21 HISTORY — DX: Unspecified osteoarthritis, unspecified site: M19.90

## 2013-01-21 HISTORY — DX: Pure hypercholesterolemia, unspecified: E78.00

## 2013-01-21 LAB — COMPREHENSIVE METABOLIC PANEL
ALT: 33 U/L (ref 0–35)
AST: 36 U/L (ref 0–37)
Albumin: 3.7 g/dL (ref 3.5–5.2)
Alkaline Phosphatase: 129 U/L — ABNORMAL HIGH (ref 39–117)
BUN: 27 mg/dL — ABNORMAL HIGH (ref 6–23)
Chloride: 105 mEq/L (ref 96–112)
Potassium: 4.1 mEq/L (ref 3.5–5.1)
Sodium: 143 mEq/L (ref 135–145)
Total Bilirubin: 0.2 mg/dL — ABNORMAL LOW (ref 0.3–1.2)

## 2013-01-21 LAB — URINALYSIS, ROUTINE W REFLEX MICROSCOPIC
Ketones, ur: 15 mg/dL — AB
Nitrite: POSITIVE — AB
Protein, ur: 30 mg/dL — AB
Urobilinogen, UA: 1 mg/dL (ref 0.0–1.0)

## 2013-01-21 LAB — RAPID URINE DRUG SCREEN, HOSP PERFORMED
Amphetamines: NOT DETECTED
Barbiturates: NOT DETECTED
Benzodiazepines: NOT DETECTED
Tetrahydrocannabinol: NOT DETECTED

## 2013-01-21 LAB — APTT: aPTT: 31 seconds (ref 24–37)

## 2013-01-21 LAB — GLUCOSE, CAPILLARY: Glucose-Capillary: 114 mg/dL — ABNORMAL HIGH (ref 70–99)

## 2013-01-21 LAB — POCT I-STAT TROPONIN I

## 2013-01-21 LAB — DIFFERENTIAL
Basophils Relative: 0 % (ref 0–1)
Monocytes Relative: 8 % (ref 3–12)
Neutro Abs: 6 10*3/uL (ref 1.7–7.7)
Neutrophils Relative %: 50 % (ref 43–77)

## 2013-01-21 LAB — CBC
Hemoglobin: 13.7 g/dL (ref 12.0–15.0)
MCHC: 33.5 g/dL (ref 30.0–36.0)
Platelets: 208 10*3/uL (ref 150–400)
RBC: 4.64 MIL/uL (ref 3.87–5.11)

## 2013-01-21 LAB — POCT I-STAT, CHEM 8
BUN: 32 mg/dL — ABNORMAL HIGH (ref 6–23)
Creatinine, Ser: 1.2 mg/dL — ABNORMAL HIGH (ref 0.50–1.10)
Glucose, Bld: 117 mg/dL — ABNORMAL HIGH (ref 70–99)
Hemoglobin: 15.3 g/dL — ABNORMAL HIGH (ref 12.0–15.0)
TCO2: 31 mmol/L (ref 0–100)

## 2013-01-21 LAB — URINE MICROSCOPIC-ADD ON

## 2013-01-21 LAB — TROPONIN I: Troponin I: <0.3 ng/mL (ref <0.30)

## 2013-01-21 LAB — PROTIME-INR: INR: 1 (ref 0.00–1.49)

## 2013-01-21 MED ORDER — ESCITALOPRAM OXALATE 10 MG PO TABS
10.0000 mg | ORAL_TABLET | Freq: Every day | ORAL | Status: DC
  Administered 2013-01-22 – 2013-01-23 (×2): 10 mg via ORAL
  Filled 2013-01-21 (×2): qty 1

## 2013-01-21 MED ORDER — HEPARIN SODIUM (PORCINE) 5000 UNIT/ML IJ SOLN
5000.0000 [IU] | Freq: Three times a day (TID) | INTRAMUSCULAR | Status: DC
  Administered 2013-01-22 – 2013-01-23 (×5): 5000 [IU] via SUBCUTANEOUS
  Filled 2013-01-21 (×8): qty 1

## 2013-01-21 MED ORDER — DONEPEZIL HCL 10 MG PO TBDP
10.0000 mg | ORAL_TABLET | Freq: Every day | ORAL | Status: DC
  Filled 2013-01-21: qty 1

## 2013-01-21 MED ORDER — PANTOPRAZOLE SODIUM 40 MG PO PACK
80.0000 mg | PACK | Freq: Every day | ORAL | Status: DC
  Administered 2013-01-23: 80 mg
  Filled 2013-01-21 (×3): qty 40

## 2013-01-21 MED ORDER — DONEPEZIL HCL 10 MG PO TABS
10.0000 mg | ORAL_TABLET | Freq: Every day | ORAL | Status: DC
  Administered 2013-01-22: 10 mg via ORAL
  Filled 2013-01-21 (×3): qty 1

## 2013-01-21 MED ORDER — POTASSIUM CHLORIDE 20 MEQ/15ML (10%) PO LIQD
10.0000 meq | Freq: Every day | ORAL | Status: DC
  Administered 2013-01-22 – 2013-01-23 (×2): 10 meq via ORAL
  Filled 2013-01-21 (×2): qty 7.5

## 2013-01-21 MED ORDER — ADULT MULTIVITAMIN W/MINERALS CH
1.0000 | ORAL_TABLET | Freq: Every day | ORAL | Status: DC
  Administered 2013-01-22 – 2013-01-23 (×2): 1 via ORAL
  Filled 2013-01-21 (×2): qty 1

## 2013-01-21 MED ORDER — DEXTROSE 5 % IV SOLN
1.0000 g | INTRAVENOUS | Status: DC
  Administered 2013-01-21 – 2013-01-22 (×2): 1 g via INTRAVENOUS
  Filled 2013-01-21 (×3): qty 10

## 2013-01-21 MED ORDER — LORATADINE 10 MG PO TABS
10.0000 mg | ORAL_TABLET | Freq: Every day | ORAL | Status: DC
  Administered 2013-01-22 – 2013-01-23 (×2): 10 mg via ORAL
  Filled 2013-01-21 (×2): qty 1

## 2013-01-21 MED ORDER — FUROSEMIDE 20 MG PO TABS
60.0000 mg | ORAL_TABLET | Freq: Every day | ORAL | Status: DC
  Administered 2013-01-22 – 2013-01-23 (×2): 60 mg via ORAL
  Filled 2013-01-21 (×2): qty 1

## 2013-01-21 MED ORDER — LISINOPRIL 40 MG PO TABS
40.0000 mg | ORAL_TABLET | Freq: Every day | ORAL | Status: DC
  Administered 2013-01-22 – 2013-01-23 (×2): 40 mg via ORAL
  Filled 2013-01-21 (×2): qty 1

## 2013-01-21 MED ORDER — MEMANTINE HCL ER 28 MG PO CP24
28.0000 mg | ORAL_CAPSULE | Freq: Every day | ORAL | Status: DC
  Administered 2013-01-22 – 2013-01-23 (×2): 28 mg via ORAL
  Filled 2013-01-21 (×2): qty 28

## 2013-01-21 MED ORDER — HYDROCORTISONE 2.5 % RE CREA
1.0000 "application " | TOPICAL_CREAM | Freq: Two times a day (BID) | RECTAL | Status: DC | PRN
  Filled 2013-01-21: qty 28.35

## 2013-01-21 MED ORDER — ASPIRIN 300 MG RE SUPP
300.0000 mg | Freq: Every day | RECTAL | Status: DC
  Filled 2013-01-21: qty 1

## 2013-01-21 MED ORDER — BENEPROTEIN PO POWD
1.0000 | Freq: Three times a day (TID) | ORAL | Status: DC
  Administered 2013-01-23 (×2): 6 g via ORAL
  Filled 2013-01-21: qty 227

## 2013-01-21 MED ORDER — METOPROLOL SUCCINATE ER 100 MG PO TB24
100.0000 mg | ORAL_TABLET | Freq: Every day | ORAL | Status: DC
  Administered 2013-01-22 – 2013-01-23 (×2): 100 mg via ORAL
  Filled 2013-01-21 (×3): qty 1

## 2013-01-21 MED ORDER — INSULIN ASPART 100 UNIT/ML ~~LOC~~ SOLN
0.0000 [IU] | SUBCUTANEOUS | Status: DC
  Administered 2013-01-22 – 2013-01-23 (×3): 1 [IU] via SUBCUTANEOUS

## 2013-01-21 MED ORDER — INSULIN GLARGINE 100 UNIT/ML ~~LOC~~ SOLN
10.0000 [IU] | Freq: Every day | SUBCUTANEOUS | Status: DC
  Administered 2013-01-22 – 2013-01-23 (×2): 10 [IU] via SUBCUTANEOUS
  Filled 2013-01-21 (×4): qty 0.1

## 2013-01-21 MED ORDER — AMLODIPINE BESYLATE 10 MG PO TABS
10.0000 mg | ORAL_TABLET | Freq: Every day | ORAL | Status: DC
  Administered 2013-01-22 – 2013-01-23 (×2): 10 mg via ORAL
  Filled 2013-01-21 (×2): qty 1

## 2013-01-21 MED ORDER — ESOMEPRAZOLE MAGNESIUM 40 MG PO PACK: 40.0000 mg | PACK | Freq: Every day | ORAL | Status: DC

## 2013-01-21 MED ORDER — ACETAMINOPHEN 325 MG PO TABS: 650.0000 mg | ORAL_TABLET | Freq: Four times a day (QID) | ORAL | Status: DC | PRN

## 2013-01-21 MED ORDER — SIMVASTATIN 40 MG PO TABS
40.0000 mg | ORAL_TABLET | Freq: Every day | ORAL | Status: DC
  Filled 2013-01-21: qty 1

## 2013-01-21 NOTE — ED Provider Notes (Signed)
History     CSN: [id number]  Arrival date & time 01/21/13  4098   First MD Initiated Contact with Patient 01/21/13 1835      Chief Complaint  Patient presents with  . Code Stroke    (Consider location/radiation/quality/duration/timing/severity/associated sxs/prior treatment) The history is provided by the EMS personnel.   patient here after becoming unresponsive at the nursing home. She had right-sided facial droop with eye deviation to the left. She does have a history of Alzheimer's dementia. CBC was above 70. EMS called and patient transported here  Past Medical History  Diagnosis Date  . CVD (cerebrovascular disease)   . Hypertension   . Diabetes mellitus without complication   . High cholesterol   . Alzheimer's dementia   . Arthritis     History reviewed. No pertinent past surgical history.  History reviewed. No pertinent family history.  History  Substance Use Topics  . Smoking status: Not on file  . Smokeless tobacco: Not on file  . Alcohol Use: No    OB History   Grav Para Term Preterm Abortions TAB SAB Ect Mult Living                  Review of Systems  Unable to perform ROS   Allergies  Review of patient's allergies indicates no known allergies.  Home Medications  No current outpatient prescriptions on file.  There were no vitals taken for this visit.  Physical Exam  Nursing note and vitals reviewed. Constitutional: She appears well-developed and well-nourished. She appears lethargic.  Non-toxic appearance. No distress.  HENT:  Head: Normocephalic and atraumatic.  Eyes: Conjunctivae, EOM and lids are normal. Pupils are equal, round, and reactive to light.  Neck: Normal range of motion. Neck supple. No tracheal deviation present. No mass present.  Cardiovascular: Normal rate, regular rhythm and normal heart sounds.  Exam reveals no gallop.   No murmur heard. Pulmonary/Chest: Effort normal and breath sounds normal. No stridor. No respiratory  distress. She has no decreased breath sounds. She has no wheezes. She has no rhonchi. She has no rales.  Abdominal: Soft. Normal appearance and bowel sounds are normal. She exhibits no distension. There is no tenderness. There is no rebound and no CVA tenderness.  Musculoskeletal: Normal range of motion. She exhibits no edema and no tenderness.  Neurological: She appears lethargic. GCS eye subscore is 4. GCS verbal subscore is 5. GCS motor subscore is 6.  Eyes deviated to left, non verbal, withdraws to pain on left  Skin: Skin is warm and dry. No abrasion and no rash noted.  Psychiatric: Her affect is blunt. She is withdrawn. She is noncommunicative.    ED Course  Procedures (including critical care time)  Labs Reviewed  GLUCOSE, CAPILLARY - Abnormal; Notable for the following:    Glucose-Capillary 114 (*)    All other components within normal limits  ETHANOL  PROTIME-INR  APTT  CBC  DIFFERENTIAL  COMPREHENSIVE METABOLIC PANEL  TROPONIN I  URINE RAPID DRUG SCREEN (HOSP PERFORMED)  URINALYSIS, ROUTINE W REFLEX MICROSCOPIC   Ct Head Wo Contrast  01/21/2013  *RADIOLOGY REPORT*  Clinical Data: Code stroke.  Decreased mental status.  Left gaze. Right droop.  CT HEAD WITHOUT CONTRAST  Technique:  Contiguous axial images were obtained from the base of the skull through the vertex without contrast.  Comparison: 12/06/2010  Findings: There is atrophy and chronic small vessel disease changes. No acute intracranial abnormality.  Specifically, no hemorrhage, hydrocephalus, mass lesion, acute infarction,  or significant intracranial injury.  No acute calvarial abnormality. Visualized paranasal sinuses and mastoids clear.  Orbital soft tissues unremarkable.  IMPRESSION: No acute intracranial abnormality.  Atrophy, chronic microvascular disease.  Critical Value/emergent results were called by telephone at the time of interpretation on 01/22/2012 at 6:36 p.m. to Dr. Thad Ranger, who verbally acknowledged  these results.   Original Report Authenticated By: Charlett Nose, M.D.      No diagnosis found.    MDM  Pt to be admitted for eval of stroke    Date: 01/21/2013  Rate: 55  Rhythm: normal sinus rhythm  QRS Axis: normal  Intervals: normal  ST/T Wave abnormalities: nonspecific ST changes  Conduction Disutrbances:left anterior fascicular block  Narrative Interpretation:   Old EKG Reviewed: none available      Toy Baker, MD 01/21/13 1954

## 2013-01-21 NOTE — Consult Note (Addendum)
Referring Physician: Freida Busman    Chief Complaint: Right sided weakness, mute  HPI: [name] is an 79 y.o. female is a NH resident with a history of dementia who was in the cafeteria to eat this evening.  Was noted to become fatigued acutely at 1745.  Then became poorly responsive and seemed to be unable to move the right.  EMS was called at that time and the patient was brought in as a code stroke.  Initial NIHSS of 21.   Son was called.  It seems that on yesterday when he visited his mother she did not seem herself and had some facial droop.   At baseline the patient is non-ambulatory, requires set-up to eat, is bowel and bladder incontinent.    Date last known well: Unable to determine Time last known well: Unable to determine tPA Given: No: Unable to determine LKW  Past Medical History  Diagnosis Date  . CVD (cerebrovascular disease)   . Hypertension   . Diabetes mellitus without complication   . High cholesterol   . Alzheimer's dementia   . Arthritis     History reviewed. No pertinent past surgical history.  Family history: Patient mute and unable to provide.  Son unable to give information  Social History:  reports that she does not drink alcohol. Her tobacco and drug histories are not on file.  Allergies: No Known Allergies  Medications: I have reviewed the patient's current medications. Prior to Admission:  Current outpatient prescriptions:acetaminophen (TYLENOL) 325 MG tablet, Take 650 mg by mouth every 6 (six) hours as needed for pain., Disp: , Rfl: ;  amLODipine (NORVASC) 10 MG tablet, Take 10 mg by mouth daily., Disp: , Rfl: ;  aspirin 81 MG chewable tablet, Chew 81 mg by mouth daily., Disp: , Rfl: ;  cetirizine (ZYRTEC) 10 MG tablet, Take 10 mg by mouth daily., Disp: , Rfl:  donepezil (ARICEPT ODT) 10 MG disintegrating tablet, Take 10 mg by mouth at bedtime., Disp: , Rfl: ;  escitalopram (LEXAPRO) 10 MG tablet, Take 10 mg by mouth daily., Disp: , Rfl: ;  esomeprazole  (NEXIUM) 40 MG packet, Take 40 mg by mouth daily before breakfast., Disp: , Rfl: ;  furosemide (LASIX) 20 MG tablet, Take 60 mg by mouth daily., Disp: , Rfl: ;  Ginkgo Biloba 40 MG TABS, Take 120 mg by mouth at bedtime., Disp: , Rfl:  glucosamine-chondroitin 500-400 MG tablet, Take 2 tablets by mouth daily., Disp: , Rfl: ;  hydrocortisone (ANUSOL-HC) 2.5 % rectal cream, Place 1 application rectally 2 (two) times daily as needed for hemorrhoids., Disp: , Rfl:  insulin aspart (NOVOLOG) 100 UNIT/ML injection, Inject 2-14 Units into the skin 4 (four) times daily -  before meals and at bedtime. Sliding Scale 151-200=2units; 201-250=4units; 251-300=6units; 301-350=8units; 351-400=10units; 401-450=12 units; greater than 450=14units, Disp: , Rfl: ;  insulin glargine (LANTUS) 100 UNIT/ML injection, Inject 23 Units into the skin at bedtime., Disp: , Rfl:  ipratropium-albuterol (DUONEB) 0.5-2.5 (3) MG/3ML SOLN, Take 3 mLs by nebulization as needed (shortness or wheezing of breath)., Disp: , Rfl: ;  lisinopril (PRINIVIL,ZESTRIL) 40 MG tablet, Take 40 mg by mouth daily., Disp: , Rfl: ;  Memantine HCl ER (NAMENDA XR) 28 MG CP24, Take 28 mg by mouth daily., Disp: , Rfl:  Menthol, Topical Analgesic, (BIOFREEZE EX), Apply 1 application topically 2 (two) times daily as needed (for neck and shoulder pain)., Disp: , Rfl: ;  metoprolol succinate (TOPROL-XL) 100 MG 24 hr tablet, Take 100 mg by  mouth daily. Take with or immediately following a meal., Disp: , Rfl: ;  Multiple Vitamin (MULTIVITAMIN WITH MINERALS) TABS, Take 1 tablet by mouth daily., Disp: , Rfl:  potassium chloride 20 MEQ/15ML (10%) solution, Take 10 mEq by mouth daily., Disp: , Rfl: ;  protein supplement (RESOURCE BENEPROTEIN) POWD, Take 1 scoop by mouth 3 (three) times daily with meals., Disp: , Rfl: ;  simvastatin (ZOCOR) 40 MG tablet, Take 40 mg by mouth daily., Disp: , Rfl:   ROS: Unable to determine  Physical Examination: BP- 116/82, HR- 86,  RR-16  Neurologic Examination: Mental Status: Lethargic.  Mute.  Unable to follow commands. Cranial Nerves: II: Discs difficult to evaluate secondary to cooperation; Does not blink to bilateral confrontation.  Pupils equal, round, reactive to light and accommodation III,IV, VI: ptosis not present, Left eye deviation with patient able to cross midline with doll's eye maneuver V,VII: right facial droop, facial light touch sensation normal bilaterally VIII: hearing normal bilaterally IX,X: gag reflex reduced XI: Decreased right shoulder shrug XII: Unable to test Motor: Moves left upper extremity spontaneously and lifts above head.  No movement noted of the RUE.  Moves legs against gravity.   Sensory: Responds to noxious stimuli throughout, localizing to pain with her LUE. Deep Tendon Reflexes: 2+ and symmetric with absent AJ's bilaterally Plantars: Right: equivocal   Left: downgoing Cerebellar: Unable to test Gait: Unable to test CV: pulses palpable throughout   Laboratory Studies:  Basic Metabolic Panel:  Recent Labs Lab 01/21/13 1851 01/21/13 1913  NA 143 144  K 4.1 4.0  CL 105 107  CO2 28  --   GLUCOSE 115* 117*  BUN 27* 32*  CREATININE 1.23* 1.20*  CALCIUM 10.0  --     Liver Function Tests:  Recent Labs Lab 01/21/13 1851  AST 36  ALT 33  ALKPHOS 129*  BILITOT 0.2*  PROT 8.0  ALBUMIN 3.7   No results found for this basename: LIPASE, AMYLASE,  in the last 168 hours No results found for this basename: AMMONIA,  in the last 168 hours  CBC:  Recent Labs Lab 01/21/13 1851 01/21/13 1913  WBC 12.1*  --   NEUTROABS 6.0  --   HGB 13.7 15.3*  HCT 40.9 45.0  MCV 88.1  --   PLT 208  --     Cardiac Enzymes:  Recent Labs Lab 01/21/13 1852  TROPONINI <0.30    BNP: No components found with this basename: POCBNP,   CBG:  Recent Labs Lab 01/21/13 1852  GLUCAP 114*    Microbiology: Results for orders placed during the hospital encounter of  12/06/10  MRSA PCR SCREENING     Status: None   Collection Time    12/07/10 10:15 AM      Result Value Range Status   MRSA by PCR    NEGATIVE Final   Value: NEGATIVE            The GeneXpert MRSA Assay (FDA     approved for NASAL specimens     only), is one component of a     comprehensive MRSA colonization     surveillance program. It is not     intended to diagnose MRSA     infection nor to guide or     monitor treatment for     MRSA infections.    Coagulation Studies:  Recent Labs  01/21/13 1851  LABPROT 13.1  INR 1.00    Urinalysis: No results found for this basename:  COLORURINE, APPERANCEUR, LABSPEC, PHURINE, GLUCOSEU, HGBUR, BILIRUBINUR, KETONESUR, PROTEINUR, UROBILINOGEN, NITRITE, LEUKOCYTESUR,  in the last 168 hours  Lipid Panel:    Component Value Date/Time   CHOL  Value: 129        ATP III CLASSIFICATION:  <200     mg/dL   Desirable  562-130  mg/dL   Borderline High  >=865    mg/dL   High        7/84/6962 0510   TRIG 51 12/08/2010 0510   HDL 45 12/08/2010 0510   CHOLHDL 2.9 12/08/2010 0510   VLDL 10 12/08/2010 0510   LDLCALC  Value: 74        Total Cholesterol/HDL:CHD Risk Coronary Heart Disease Risk Table                     Men   Women  1/2 Average Risk   3.4   3.3  Average Risk       5.0   4.4  2 X Average Risk   9.6   7.1  3 X Average Risk  23.4   11.0        Use the calculated Patient Ratio above and the CHD Risk Table to determine the patient's CHD Risk.        ATP III CLASSIFICATION (LDL):  <100     mg/dL   Optimal  952-841  mg/dL   Near or Above                    Optimal  130-159  mg/dL   Borderline  324-401  mg/dL   High  >027     mg/dL   Very High 2/53/6644 0347    HgbA1C:  Lab Results  Component Value Date   HGBA1C  Value: 6.8 (NOTE)                                                                       According to the ADA Clinical Practice Recommendations for 2011, when HbA1c is used as a screening test:   >=6.5%   Diagnostic of Diabetes Mellitus            (if abnormal result  is confirmed)  5.7-6.4%   Increased risk of developing Diabetes Mellitus  References:Diagnosis and Classification of Diabetes Mellitus,Diabetes Care,2011,34(Suppl 1):S62-S69 and Standards of Medical Care in         Diabetes - 2011,Diabetes Care,2011,34  (Suppl 1):S11-S61.* 12/07/2010    Urine Drug Screen:   No results found for this basename: labopia, cocainscrnur, labbenz, amphetmu, thcu, labbarb    Alcohol Level:  Recent Labs Lab 01/21/13 1851  ETH <11    Other results: EKG: sinus rhythm at 55 bpm  Imaging: Ct Head Wo Contrast  01/21/2013  **ADDENDUM** CREATED: 01/21/2013 19:02:44  Dr. Thad Ranger was paged three separate times but did not return call.  Therefore, these results were given to Dr. Freida Busman at 7:05 p.m.  **END ADDENDUM** SIGNED BY: Aubery Lapping. [place], M.D.   01/21/2013  *RADIOLOGY REPORT*  Clinical Data: Code stroke.  Decreased mental status.  Left gaze. Right droop.  CT HEAD WITHOUT CONTRAST  Technique:  Contiguous axial images were obtained from the base of the skull through the vertex without  contrast.  Comparison: 12/06/2010  Findings: There is atrophy and chronic small vessel disease changes. No acute intracranial abnormality.  Specifically, no hemorrhage, hydrocephalus, mass lesion, acute infarction, or significant intracranial injury.  No acute calvarial abnormality. Visualized paranasal sinuses and mastoids clear.  Orbital soft tissues unremarkable.  IMPRESSION: No acute intracranial abnormality.  Atrophy, chronic microvascular disease.  Critical Value/emergent results were called by telephone at the time of interpretation on 01/22/2012 at 6:36 p.m. to Dr. Thad Ranger, who verbally acknowledged these results.   Original Report Authenticated By: Charlett Nose, M.D.     Assessment: 79 y.o. female presenting with mutism, left eye deviation, right facial droop and right upper extremity weakness.  CT of the head reviewed and shows no acute changes.  Acute infarct  suspected and although patient initially seemed to arrive within the treatment window after speaking with the son a LKW time was unable to be identified.  Patient also with a poor baseline. tPA not administered.  Stroke work up to be initiated.    Stroke Risk Factors - diabetes mellitus, hyperlipidemia and hypertension  Plan: 1. HgbA1c, fasting lipid panel 2. MRI, MRA  of the brain without contrast 3. PT consult, OT consult, Speech consult 4. Echocardiogram 5. Carotid dopplers 6. Prophylactic therapy-Patient unable to take Plavix because will need to remain NPO.  Would start ASA rectally at 300mg  and change to Plavix once able to take po medications 7. Risk factor modification 8. Telemetry monitoring 9. Frequent neuro checks  Case discussed with Dr. Myer Peer, MD [facility] [phone] 01/21/2013, 8:01 PM

## 2013-01-21 NOTE — ED Notes (Signed)
Pt. Is more alert now. Able to follow some simple commands. She could hold both her upper arms up without drift. She follows this RN with eyes now. Still remains aphasic. Family at bedside.

## 2013-01-21 NOTE — ED Notes (Signed)
Unable to get IV access. IV team paged and multiple nurses at bedside. MD attempting with [place].

## 2013-01-21 NOTE — ED Notes (Signed)
Per EMS, pt from [facility]. Was LSN at 17:45. Was in the dining room and was talking and then became unresponsive with right facial droop and right sided weakness. Also gaze to the left. Pt not following commands. Responsive to pain

## 2013-01-21 NOTE — ED Notes (Signed)
Neurologist at bedside has decided that Pt. Is not candidate for TPA at this time.

## 2013-01-21 NOTE — H&P (Addendum)
[facility] History and Physical  [name] NGE:[id number] DOB: [birth year]    PCP:  Dr Angela Cox  Chief Complaint: speech problem, facial droop, and right sided weakness.  HPI: [name] is a right handed 79 y.o. female with multiple medical problems including prior CVA, HTN, DM, hypercholesterolemia, Dementia, arthritis, NH Resident, non-ambulatory, presents to the ER as a code stroke with inability to speak, right facial droop and right sided weakness.  Dr Thad Ranger of neurology saw her and determined that she was not a thrombolytic candidate due to the inexact time of Ictus, along with poor functional baseline.  Evaluation in the ER also included a UA that is positive for infection, head CT showed microvascular disease, but no acute process, her BS is OK, and she has normal hemoglobin.  Her WBC is 12K.  Hospitalist was asked to admit her for completing stroke work up.  Rewiew of Systems:   She is aphasic and I am not able to obtain meaningful ROS.   Past Medical History  Diagnosis Date  . CVD (cerebrovascular disease)   . Hypertension   . Diabetes mellitus without complication   . High cholesterol   . Alzheimer's dementia   . Arthritis     History reviewed. No pertinent past surgical history.  Medications:  HOME MEDS: Prior to Admission medications   Medication Sig Start Date End Date Taking? Authorizing Provider  acetaminophen (TYLENOL) 325 MG tablet Take 650 mg by mouth every 6 (six) hours as needed for pain.   Yes Historical Provider, MD  amLODipine (NORVASC) 10 MG tablet Take 10 mg by mouth daily.   Yes Historical Provider, MD  aspirin 81 MG chewable tablet Chew 81 mg by mouth daily.   Yes Historical Provider, MD  cetirizine (ZYRTEC) 10 MG tablet Take 10 mg by mouth daily.   Yes Historical Provider, MD  donepezil (ARICEPT ODT) 10 MG disintegrating tablet Take 10 mg by mouth at bedtime.   Yes Historical Provider, MD  escitalopram (LEXAPRO) 10 MG  tablet Take 10 mg by mouth daily.   Yes Historical Provider, MD  esomeprazole (NEXIUM) 40 MG packet Take 40 mg by mouth daily before breakfast.   Yes Historical Provider, MD  furosemide (LASIX) 20 MG tablet Take 60 mg by mouth daily.   Yes Historical Provider, MD  Ginkgo Biloba 40 MG TABS Take 120 mg by mouth at bedtime.   Yes Historical Provider, MD  glucosamine-chondroitin 500-400 MG tablet Take 2 tablets by mouth daily.   Yes Historical Provider, MD  hydrocortisone (ANUSOL-HC) 2.5 % rectal cream Place 1 application rectally 2 (two) times daily as needed for hemorrhoids.   Yes Historical Provider, MD  insulin aspart (NOVOLOG) 100 UNIT/ML injection Inject 2-14 Units into the skin 4 (four) times daily -  before meals and at bedtime. Sliding Scale 151-200=2units; 201-250=4units; 251-300=6units; 301-350=8units; 351-400=10units; 401-450=12 units; greater than 450=14units   Yes Historical Provider, MD  insulin glargine (LANTUS) 100 UNIT/ML injection Inject 23 Units into the skin at bedtime.   Yes Historical Provider, MD  ipratropium-albuterol (DUONEB) 0.5-2.5 (3) MG/3ML SOLN Take 3 mLs by nebulization as needed (shortness or wheezing of breath).   Yes Historical Provider, MD  lisinopril (PRINIVIL,ZESTRIL) 40 MG tablet Take 40 mg by mouth daily.   Yes Historical Provider, MD  Memantine HCl ER (NAMENDA XR) 28 MG CP24 Take 28 mg by mouth daily.   Yes Historical Provider, MD  Menthol, Topical Analgesic, (BIOFREEZE EX) Apply 1 application topically 2 (two) times  daily as needed (for neck and shoulder pain).   Yes Historical Provider, MD  metoprolol succinate (TOPROL-XL) 100 MG 24 hr tablet Take 100 mg by mouth daily. Take with or immediately following a meal.   Yes Historical Provider, MD  Multiple Vitamin (MULTIVITAMIN WITH MINERALS) TABS Take 1 tablet by mouth daily.   Yes Historical Provider, MD  potassium chloride 20 MEQ/15ML (10%) solution Take 10 mEq by mouth daily.   Yes Historical Provider, MD  protein  supplement (RESOURCE BENEPROTEIN) POWD Take 1 scoop by mouth 3 (three) times daily with meals.   Yes Historical Provider, MD  simvastatin (ZOCOR) 40 MG tablet Take 40 mg by mouth daily.   Yes Historical Provider, MD     Allergies:  No Known Allergies  Social History:   reports that she does not drink alcohol. Her tobacco and drug histories are not on file.  Family History: History reviewed. No pertinent family history.   Physical Exam: Filed Vitals:   01/21/13 2100 01/21/13 2115 01/21/13 2130 01/21/13 2145  BP: 116/47 133/61 142/64 146/68  Pulse: 54 54 111 59  Temp:      TempSrc:      Resp: 14 15 17 16   SpO2: 100% 100% 97% 100%   Blood pressure 146/68, pulse 59, temperature 97.3 F (36.3 C), temperature source Rectal, resp. rate 16, SpO2 100.00%.  GEN:  patient lying in the stretcher in no acute distress; does not speak, and follow no command. PSYCH:   does not appear anxious or depressed; affect is appropriate. Opened her eyes and tracking. HEENT: Mucous membranes pink and anicteric; PERRLA; EOM intact; no cervical lymphadenopathy nor thyromegaly or carotid bruit; no JVD; There were no stridor. Neck is very supple. Breasts:: Not examined CHEST WALL: No tenderness CHEST: Normal respiration, clear to auscultation bilaterally.  HEART: Regular rate and rhythm.  There are no murmur, rub, or gallops.   BACK: No kyphosis or scoliosis; no CVA tenderness ABDOMEN: soft and non-tender; no masses, no organomegaly, normal abdominal bowel sounds; no pannus; no intertriginous candida. There is no rebound and no distention. Rectal Exam: Not done EXTREMITIES: No bone or joint deformity; age-appropriate arthropathy of the hands and knees; no edema; no ulcerations.  There is no calf tenderness. Left strength is slightly stronger than the right side.  Intermittently she moves her right upper extremity. Genitalia: not examined PULSES: 2+ and symmetric SKIN: Normal hydration no rash or  ulceration CNS: Moves her left more than her right side.  Right facial droop.  Would not follow any command. She withdrawed her left foot when touched. Labs on Admission:  Basic Metabolic Panel:  Recent Labs Lab 01/21/13 1851 01/21/13 1913  NA 143 144  K 4.1 4.0  CL 105 107  CO2 28  --   GLUCOSE 115* 117*  BUN 27* 32*  CREATININE 1.23* 1.20*  CALCIUM 10.0  --    Liver Function Tests:  Recent Labs Lab 01/21/13 1851  AST 36  ALT 33  ALKPHOS 129*  BILITOT 0.2*  PROT 8.0  ALBUMIN 3.7   No results found for this basename: LIPASE, AMYLASE,  in the last 168 hours No results found for this basename: AMMONIA,  in the last 168 hours CBC:  Recent Labs Lab 01/21/13 1851 01/21/13 1913  WBC 12.1*  --   NEUTROABS 6.0  --   HGB 13.7 15.3*  HCT 40.9 45.0  MCV 88.1  --   PLT 208  --    Cardiac Enzymes:  Recent Labs  Lab 01/21/13 1852  TROPONINI <0.30    CBG:  Recent Labs Lab 01/21/13 1852  GLUCAP 114*     Radiological Exams on Admission: Ct Head Wo Contrast  01/21/2013  **ADDENDUM** CREATED: 01/21/2013 19:02:44  Dr. Thad Ranger was paged three separate times but did not return call.  Therefore, these results were given to Dr. Freida Busman at 7:05 p.m.  **END ADDENDUM** SIGNED BY: Aubery Lapping. [place], M.D.   01/21/2013  *RADIOLOGY REPORT*  Clinical Data: Code stroke.  Decreased mental status.  Left gaze. Right droop.  CT HEAD WITHOUT CONTRAST  Technique:  Contiguous axial images were obtained from the base of the skull through the vertex without contrast.  Comparison: 12/06/2010  Findings: There is atrophy and chronic small vessel disease changes. No acute intracranial abnormality.  Specifically, no hemorrhage, hydrocephalus, mass lesion, acute infarction, or significant intracranial injury.  No acute calvarial abnormality. Visualized paranasal sinuses and mastoids clear.  Orbital soft tissues unremarkable.  IMPRESSION: No acute intracranial abnormality.  Atrophy, chronic microvascular  disease.  Critical Value/emergent results were called by telephone at the time of interpretation on 01/22/2012 at 6:36 p.m. to Dr. Thad Ranger, who verbally acknowledged these results.   Original Report Authenticated By: Charlett Nose, M.D.     Assessment/Plan Present on Admission:  . CVA (cerebral vascular accident) . Alzheimer's dementia . Arthritis . CKD (chronic kidney disease) . Dysphagia . HTN (hypertension) . Hypercholesterolemia  PLAN:  Will admit for completing the CVA work up to include MRI/MRA of the brain, coratid doppler and ECHO.  She will be given rectal ASA until she is able to swallow.  She likely had a CVA of the Left MCA territory.  I will give her gentle IVF with Dextro.  She does have a UTI and will tx with Rocephin.  I discussed her code status with her daughter, and she is a full code.  She will readdress this with her brother and update [place] if there is a change.  She is stable, and will be admitted to [facility] service under telemetry.  Thank you for allowing [place] to partake in the care of your patient.  Other plans as per orders.  Code Status: FULL Unk Lightning, MD. [facility] Pager [phone] 7pm to 7am.  01/21/2013, 10:59 PM

## 2013-01-22 ENCOUNTER — Encounter (HOSPITAL_COMMUNITY): Payer: Self-pay | Admitting: *Deleted

## 2013-01-22 ENCOUNTER — Inpatient Hospital Stay (HOSPITAL_COMMUNITY): Payer: Medicare Other

## 2013-01-22 DIAGNOSIS — I319 Disease of pericardium, unspecified: Secondary | ICD-10-CM

## 2013-01-22 LAB — LIPID PANEL
Cholesterol: 146 mg/dL (ref 0–200)
LDL Cholesterol: 61 mg/dL (ref 0–99)
Total CHOL/HDL Ratio: 2.3 RATIO
Triglycerides: 107 mg/dL (ref <150)
VLDL: 21 mg/dL (ref 0–40)

## 2013-01-22 LAB — GLUCOSE, CAPILLARY
Glucose-Capillary: 110 mg/dL — ABNORMAL HIGH (ref 70–99)
Glucose-Capillary: 98 mg/dL (ref 70–99)
Glucose-Capillary: 99 mg/dL (ref 70–99)
Glucose-Capillary: 147 mg/dL — ABNORMAL HIGH (ref 70–99)

## 2013-01-22 LAB — HEMOGLOBIN A1C: Mean Plasma Glucose: 143 mg/dL — ABNORMAL HIGH (ref <117)

## 2013-01-22 MED ORDER — ATORVASTATIN CALCIUM 20 MG PO TABS
20.0000 mg | ORAL_TABLET | Freq: Every day | ORAL | Status: DC
  Administered 2013-01-23: 20 mg via ORAL
  Filled 2013-01-22 (×3): qty 1

## 2013-01-22 MED ORDER — CLOPIDOGREL BISULFATE 75 MG PO TABS
75.0000 mg | ORAL_TABLET | Freq: Every day | ORAL | Status: DC
  Administered 2013-01-23: 75 mg via ORAL
  Filled 2013-01-22 (×2): qty 1

## 2013-01-22 NOTE — Progress Notes (Signed)
Stroke Team Progress Note  HISTORY [name] is an 79 y.o. female is a NH resident with a history of dementia who was in the cafeteria to eat this evening. Was noted to become fatigued acutely at 1745. Then became poorly responsive and seemed to be unable to move the right. EMS was called at that time and the patient was brought in as a code stroke. Initial NIHSS of 21.  Son was called. It seems that on yesterday when he visited his mother she did not seem herself and had some facial droop. At baseline the patient is non-ambulatory, requires set-up to eat, is bowel and bladder incontinent. Patient was not a TPA candidate secondary to inability to determine clear last know well. She was admitted for further evaluation and treatment.  SUBJECTIVE Her family is at the bedside.  Overall she feels her condition is gradually worsening.   Dr. Pearlean Brownie called and spoke with nursing staff at facility where pt lives. They noticed acute changes at 1730 01/21/2013. Pt has baseline dementia, cannot walk, and needs assistance with all ADLs.  OBJECTIVE Most recent Vital Signs: Filed Vitals:   01/22/13 0300 01/22/13 0500 01/22/13 0650 01/22/13 0905  BP: 151/71 156/76 148/57 153/69  Pulse: 59 71 59 70  Temp: 98 F (36.7 C) 98.3 F (36.8 C) 98.4 F (36.9 C) 98.3 F (36.8 C)  TempSrc: Axillary Oral Oral Axillary  Resp: 20 18 18    Height:      Weight:      SpO2: 98% 96% 100%    CBG (last 3)   Recent Labs  01/21/13 1852 01/22/13 0054 01/22/13 0520  GLUCAP 114* 137* 110*    IV Fluid Intake:     MEDICATIONS  . amLODipine  10 mg Oral Daily  . aspirin  300 mg Rectal Daily  . atorvastatin  20 mg Oral Daily  . cefTRIAXone (ROCEPHIN)  IV  1 g Intravenous Q24H  . donepezil  10 mg Oral QHS  . escitalopram  10 mg Oral Daily  . furosemide  60 mg Oral Daily  . heparin  5,000 Units Subcutaneous Q8H  . insulin aspart  0-9 Units Subcutaneous Q4H  . insulin glargine  10 Units Subcutaneous QHS  .  lisinopril  40 mg Oral Daily  . loratadine  10 mg Oral Daily  . Memantine HCl ER  28 mg Oral Daily  . metoprolol succinate  100 mg Oral Q breakfast  . multivitamin with minerals  1 tablet Oral Daily  . pantoprazole sodium  80 mg Per Tube Daily  . potassium chloride  10 mEq Oral Daily  . protein supplement  1 scoop Oral TID WC   PRN:  acetaminophen, hydrocortisone  Diet:  Dysphagia 3 thin liquids Activity:   Bathroom privileges with assistance DVT Prophylaxis:  Heparin 5000 units sq tid   CLINICALLY SIGNIFICANT STUDIES Basic Metabolic Panel:  Recent Labs Lab 01/21/13 1851 01/21/13 1913  NA 143 144  K 4.1 4.0  CL 105 107  CO2 28  --   GLUCOSE 115* 117*  BUN 27* 32*  CREATININE 1.23* 1.20*  CALCIUM 10.0  --    Liver Function Tests:  Recent Labs Lab 01/21/13 1851  AST 36  ALT 33  ALKPHOS 129*  BILITOT 0.2*  PROT 8.0  ALBUMIN 3.7   CBC:  Recent Labs Lab 01/21/13 1851 01/21/13 1913  WBC 12.1*  --   NEUTROABS 6.0  --   HGB 13.7 15.3*  HCT 40.9 45.0  MCV 88.1  --  PLT 208  --    Coagulation:  Recent Labs Lab 01/21/13 1851  LABPROT 13.1  INR 1.00   Cardiac Enzymes:  Recent Labs Lab 01/21/13 1852  TROPONINI <0.30   Urinalysis:  Recent Labs Lab 01/21/13 2010  COLORURINE YELLOW  LABSPEC 1.018  PHURINE 6.5  GLUCOSEU NEGATIVE  HGBUR TRACE*  BILIRUBINUR NEGATIVE  KETONESUR 15*  PROTEINUR 30*  UROBILINOGEN 1.0  NITRITE POSITIVE*  LEUKOCYTESUR MODERATE*   Lipid Panel    Component Value Date/Time   CHOL 146 01/22/2013 0500   TRIG 107 01/22/2013 0500   HDL 64 01/22/2013 0500   CHOLHDL 2.3 01/22/2013 0500   VLDL 21 01/22/2013 0500   LDLCALC 61 01/22/2013 0500   HgbA1C  Lab Results  Component Value Date   HGBA1C  Value: 6.8 (NOTE)                                                                       According to the ADA Clinical Practice Recommendations for 2011, when HbA1c is used as a screening test:   >=6.5%   Diagnostic of Diabetes Mellitus            (if abnormal result  is confirmed)  5.7-6.4%   Increased risk of developing Diabetes Mellitus  References:Diagnosis and Classification of Diabetes Mellitus,Diabetes Care,2011,34(Suppl 1):S62-S69 and Standards of Medical Care in         Diabetes - 2011,Diabetes Care,2011,34  (Suppl 1):S11-S61.* 12/07/2010    Urine Drug Screen:     Component Value Date/Time   LABOPIA NONE DETECTED 01/21/2013 2010   COCAINSCRNUR NONE DETECTED 01/21/2013 2010   LABBENZ NONE DETECTED 01/21/2013 2010   AMPHETMU NONE DETECTED 01/21/2013 2010   THCU NONE DETECTED 01/21/2013 2010   LABBARB NONE DETECTED 01/21/2013 2010    Alcohol Level:  Recent Labs Lab 01/21/13 1851  ETH <11   CT of the brain  01/21/2013  : No acute intracranial abnormality.  Atrophy, chronic microvascular disease.    MRI of the brain    MRA of the brain    2D Echocardiogram    Carotid Doppler    CXR    EKG  normal sinus rhythm.   Therapy Recommendations   Physical Exam   Elderly African american lady not in distress.Awake alert. Afebrile. Head is nontraumatic. Neck is supple without bruit. Hearing is normal. Cardiac exam no murmur or gallop. Lungs are clear to auscultation. Distal pulses are well felt. Neurological Exam ;  awake alert severe expressive aphasia and speaks only a few words and nonsensical sentences. Able to understand and follow only simple midline commands. Extraocular movements are full range without nystagmus. Blinks to threat bilaterally. Face is symmetric without weakness. Tongue is midline. Motor system exam reveals no upper or lower eczema to drift however this appears to be mild right grip and finger tapping weakness on the right. Sensation withdraws to pain bilaterally. Both plantars are downgoing. Coordination difficult to test. Gait not tested ASSESSMENT [name] is a 79 y.o. female presenting with acute fatigue, right hemiparesis. She is not talking today.  Initial CT imaging confirms no acute infarct.  Dx:  Left MCA branch infarct  vs seizure. Suspect has baseline dementia., imaging pending.  On aspirin 81 mg  orally every day prior to admission. Now on aspirin 300 mg suppository for secondary stroke prevention. Patient with global aphasia, right hemiparesis. Work up underway.   Baseline dementia  In SNF, w/c bound, help w/ all ADLs Hyperlipidemia, LDL 61, on zocor 40 PTA, on lipitor 20 now, at goal LDL < 100 Diabetes, HgbA1c pending, goal < 7.0  Hospital day # 1  TREATMENT/PLAN  Continue aspirin suppository for secondary stroke prevention.  Check EEG  F/u MRI, MRA, 2D, Carotid dopplers  D/W family and answered questions.  Annie Main, MSN, RN, ANVP-BC, ANP-BC, [facility] Pager: [phone] 01/22/2013 11:12 AM  I have personally obtained a history, examined the patient, evaluated imaging results, and formulated the assessment and plan of care. I agree with the above.  Delia Heady, MD

## 2013-01-22 NOTE — Progress Notes (Signed)
VASCULAR LAB PRELIMINARY  PRELIMINARY  PRELIMINARY  PRELIMINARY  Carotid duplex completed.    Preliminary report: Technically difficult exam.  Mild plaque disease bilaterally.  On the right side, unable to visualize the ECA, bifurcation was high.  The vessel identified appeared to be the ICA and the velocities were not elevated.  No increased velocities noted on the left.  [name], RVT 01/22/2013, 1:40 PM

## 2013-01-22 NOTE — Progress Notes (Signed)
Pt is currently refusing to participate and pulling away when attempting to do NIH. Pt refused to allow oral temp reading, which she has been compliant with thus far. Pt seems agitated when attempting to perform neuro checks and assessment. Performed basic assessment and will attempt neuro checks after allowing patient to rest.  Claudette Head, RN, 01-22-13

## 2013-01-22 NOTE — Progress Notes (Signed)
EEG COMPLETED

## 2013-01-22 NOTE — Progress Notes (Signed)
Patient ID: [name], female   DOB: 1926-12-07, 79 y.o.   MRN: [id number]        PROGRESS NOTE  DATE:  01/07/2013    FACILITY:  [facility]  LEVEL OF CARE: SNF (31)  Acute Visit  CHIEF COMPLAINT:  Manage renal insufficiency.   HISTORY OF PRESENT ILLNESS: I was requested by the staff to assess the patient regarding above problem(s):  On 01/03/2013, patient's creatinine was 1.03, BUN 13.  On 12/20/2012, creatinine was 1.14.  Patient denies increasing lower extremity swelling or confusion.    PAST MEDICAL HISTORY : Reviewed.  No changes.  CURRENT MEDICATIONS: Reviewed per [facility]  PHYSICAL EXAMINATION  GENERAL: no acute distress, moderately obese body habitus CARDIAC: RRR, no murmur,no extra heart sounds EDEMA/VARICOSITIES:  +1 bilateral lower extremity edema  ARTERIAL:  pedal pulses diminished  PSYCHIATRIC: the patient is alert & oriented to person, affect & behavior appropriate  ASSESSMENT/PLAN:  Renal insufficiency.  Renal functions improved.    CPT CODE: 78295

## 2013-01-22 NOTE — Progress Notes (Signed)
  Echocardiogram 2D Echocardiogram has been performed.  [name] FRANCES 01/22/2013, 1:52 PM

## 2013-01-22 NOTE — Progress Notes (Signed)
Utilization review completed. Nemiah Bubar, RN, BSN. 

## 2013-01-22 NOTE — Progress Notes (Signed)
[facility] PROGRESS NOTE  [name]:[id number] DOB: 04/01/1927 DOA: 01/21/2013 PCP: No primary provider on file.  Assessment/Plan: 1-aphasia and facial droop: patient with underline dementia making difficult neurologic exam. -patient was on ASA prior to event; will switch to plavix for secondary prevention as she pass now swallowing eval -follow MRI, carotid dopplers and 2-D echo -will also order EEG -follow neuro rec's -PT/SPT  2-Presumed UTI: follow urine cx's; continue rocephin  3-HLD: continue statins   4-Diabetes: continue SSI. A1C pending  5-dementia: continue namenda  DVT: heparin.  Code Status: Full  Family Communication: no family at bedside Disposition Plan: SNF when medically stable   Consultants:  neurology  Procedures:  See below for x-ray  Antibiotics:  rocephin  HPI/Subjective: Afebrile, no CP, no SOB. Awake and able to follow some commands. Per SPT dysphagia 3 diet and thin liquids.  Objective: Filed Vitals:   01/22/13 0500 01/22/13 0650 01/22/13 0905 01/22/13 1121  BP: 156/76 148/57 153/69 147/71  Pulse: 71 59 70 62  Temp: 98.3 F (36.8 C) 98.4 F (36.9 C) 98.3 F (36.8 C) 98.6 F (37 C)  TempSrc: Oral Oral Axillary   Resp: 18 18  20   Height:      Weight:      SpO2: 96% 100%  100%   No intake or output data in the 24 hours ending 01/22/13 1525 Filed Weights   01/21/13 2255  Weight: 75.978 kg (167 lb 8 oz)    Exam:   General:  Awake, afebrile, moves limbs spontaneously.  Cardiovascular: S1 and S2, no rubs or gallosp  Respiratory:CTA bilaterally  Abdomen: soft, NT, ND  Musculoskeletal: no edema  Neuro: at baseline unable to walk, needs assistance with ADL's. Patient with facial droop, awake, able to follow some commands, no nystagmus, PERRL  Data Reviewed: Basic Metabolic Panel:  Recent Labs Lab 01/21/13 1851 01/21/13 1913  NA 143 144  K 4.1 4.0  CL 105 107  CO2 28  --   GLUCOSE 115* 117*  BUN 27*  32*  CREATININE 1.23* 1.20*  CALCIUM 10.0  --    Liver Function Tests:  Recent Labs Lab 01/21/13 1851  AST 36  ALT 33  ALKPHOS 129*  BILITOT 0.2*  PROT 8.0  ALBUMIN 3.7   CBC:  Recent Labs Lab 01/21/13 1851 01/21/13 1913  WBC 12.1*  --   NEUTROABS 6.0  --   HGB 13.7 15.3*  HCT 40.9 45.0  MCV 88.1  --   PLT 208  --    Cardiac Enzymes:  Recent Labs Lab 01/21/13 1852  TROPONINI <0.30   BNP (last 3 results) No results found for this basename: PROBNP,  in the last 8760 hours CBG:  Recent Labs Lab 01/21/13 1852 01/22/13 0054 01/22/13 0520 01/22/13 1250  GLUCAP 114* 137* 110* 98    Studies: Ct Head Wo Contrast  01/21/2013  **ADDENDUM** CREATED: 01/21/2013 19:02:44  Dr. Thad Ranger was paged three separate times but did not return call.  Therefore, these results were given to Dr. Freida Busman at 7:05 p.m.  **END ADDENDUM** SIGNED BY: Aubery Lapping. [place], M.D.   01/21/2013  *RADIOLOGY REPORT*  Clinical Data: Code stroke.  Decreased mental status.  Left gaze. Right droop.  CT HEAD WITHOUT CONTRAST  Technique:  Contiguous axial images were obtained from the base of the skull through the vertex without contrast.  Comparison: 12/06/2010  Findings: There is atrophy and chronic small vessel disease changes. No acute intracranial abnormality.  Specifically, no hemorrhage, hydrocephalus, mass  lesion, acute infarction, or significant intracranial injury.  No acute calvarial abnormality. Visualized paranasal sinuses and mastoids clear.  Orbital soft tissues unremarkable.  IMPRESSION: No acute intracranial abnormality.  Atrophy, chronic microvascular disease.  Critical Value/emergent results were called by telephone at the time of interpretation on 01/22/2012 at 6:36 p.m. to Dr. Thad Ranger, who verbally acknowledged these results.   Original Report Authenticated By: Charlett Nose, M.D.     Scheduled Meds: . amLODipine  10 mg Oral Daily  . aspirin  300 mg Rectal Daily  . atorvastatin  20 mg Oral  Daily  . cefTRIAXone (ROCEPHIN)  IV  1 g Intravenous Q24H  . donepezil  10 mg Oral QHS  . escitalopram  10 mg Oral Daily  . furosemide  60 mg Oral Daily  . heparin  5,000 Units Subcutaneous Q8H  . insulin aspart  0-9 Units Subcutaneous Q4H  . insulin glargine  10 Units Subcutaneous QHS  . lisinopril  40 mg Oral Daily  . loratadine  10 mg Oral Daily  . Memantine HCl ER  28 mg Oral Daily  . metoprolol succinate  100 mg Oral Q breakfast  . multivitamin with minerals  1 tablet Oral Daily  . pantoprazole sodium  80 mg Per Tube Daily  . potassium chloride  10 mEq Oral Daily  . protein supplement  1 scoop Oral TID WC   Continuous Infusions:   Principal Problem:   CVA (cerebral vascular accident) Active Problems:   Alzheimer's dementia   Arthritis   CKD (chronic kidney disease)   Dysphagia   HTN (hypertension)   Hypercholesterolemia    Time spent: 30 minutes    [name]  [facility] Pager [phone]. If 7PM-7AM, please contact night-coverage at [email or web address], password [facility] 01/22/2013, 3:25 PM  LOS: 1 day

## 2013-01-22 NOTE — Evaluation (Signed)
Clinical/Bedside Swallow Evaluation Patient Details  Name: [name] MRN: [id number] Date of Birth: 12-07-1926  Today's Date: 01/22/2013 Time: 1006-1023 SLP Time Calculation (min): 17 min  Past Medical History:  Past Medical History  Diagnosis Date  . CVD (cerebrovascular disease)   . Hypertension   . Diabetes mellitus without complication   . High cholesterol   . Alzheimer's dementia   . Arthritis    Past Surgical History: History reviewed. No pertinent past surgical history. HPI:  79 y.o. female presenting with mutism, left eye deviation, right facial droop and right upper extremity weakness.  CT of the head reviewed and shows no acute changes.  Acute infarct suspected and although patient initially seemed to arrive within the treatment window after speaking with the son a LKW time was unable to be identified.  Patient also with a poor baseline.   Assessment / Plan / Recommendation Clinical Impression  Pt demosntrates baseline oral and oropharyngeal fucntion consistent with findings from recent MBS. Pt without signs of aspriation, demosntrated ability to masticate solids, but transit was slightly prolonged. Recommend pt initate a dys 3 mechanical soft diet.    Also, SLP does not have orders at this time to complete  cognitive linguistic eval, however pt much altered from baseline. At baseline, per her daughter she could express wants/needs, play jeopardy and Wheel of Fortune. Now pt with stereotypy, jargon when trying to verbalize. Pt must have f/u SLP therapy at SNF. Appropriate to defer acute eval as pts needs have already been identified and SNF therapist will be better acquatined with pts baseline cognition. Dtr agrees.     Aspiration Risk  Mild    Diet Recommendation Dysphagia 3 (Mechanical Soft);Thin liquid   Liquid Administration via: Cup;Straw Medication Administration: Whole meds with liquid Supervision: Patient able to self feed;Full supervision/cueing for  compensatory strategies Compensations: Slow rate;Small sips/bites Postural Changes and/or Swallow Maneuvers: Seated upright 90 degrees;Upright 30-60 min after meal    Other  Recommendations Oral Care Recommendations: Oral care BID   Follow Up Recommendations  Skilled Nursing facility    Frequency and Duration        Pertinent Vitals/Pain NA    SLP Swallow Goals     Swallow Study Prior Functional Status       General HPI: 79 y.o. female presenting with mutism, left eye deviation, right facial droop and right upper extremity weakness.  CT of the head reviewed and shows no acute changes.  Acute infarct suspected and although patient initially seemed to arrive within the treatment window after speaking with the son a LKW time was unable to be identified.  Patient also with a poor baseline. Type of Study: Bedside swallow evaluation Previous Swallow Assessment: MBS 11/13 - Dys 3/thin. Delay without asp/penetration. Mild oral dysphagia.  Diet Prior to this Study: NPO Temperature Spikes Noted: No Respiratory Status: Supplemental O2 delivered via (comment) History of Recent Intubation: No Behavior/Cognition: Alert;Cooperative;Requires cueing Oral Cavity - Dentition: Adequate natural dentition Self-Feeding Abilities: Able to feed self Patient Positioning: Upright in bed Baseline Vocal Quality: Clear Volitional Cough: Cognitively unable to elicit Volitional Swallow: Unable to elicit    Oral/Motor/Sensory Function Overall Oral Motor/Sensory Function: Appears within functional limits for tasks assessed   Ice Chips     Thin Liquid Thin Liquid: Within functional limits Presentation: Cup;Straw;Self Fed    Nectar Thick Nectar Thick Liquid: Not tested   Honey Thick Honey Thick Liquid: Not tested   Puree Puree: Within functional limits   Solid  GO    Solid: Impaired Presentation: Self Fed Oral Phase Functional Implications:  (prolonged mastication/transit, functional)        Vertis Bauder, Riley Nearing 01/22/2013,10:41 AM

## 2013-01-23 ENCOUNTER — Encounter (HOSPITAL_COMMUNITY): Payer: Self-pay | Admitting: *Deleted

## 2013-01-23 DIAGNOSIS — F0280 Dementia in other diseases classified elsewhere without behavioral disturbance: Secondary | ICD-10-CM

## 2013-01-23 DIAGNOSIS — E7800 Pure hypercholesterolemia, unspecified: Secondary | ICD-10-CM

## 2013-01-23 DIAGNOSIS — I6350 Cerebral infarction due to unspecified occlusion or stenosis of unspecified cerebral artery: Secondary | ICD-10-CM

## 2013-01-23 DIAGNOSIS — E1310 Other specified diabetes mellitus with ketoacidosis without coma: Secondary | ICD-10-CM

## 2013-01-23 DIAGNOSIS — N189 Chronic kidney disease, unspecified: Secondary | ICD-10-CM

## 2013-01-23 DIAGNOSIS — R1310 Dysphagia, unspecified: Secondary | ICD-10-CM

## 2013-01-23 DIAGNOSIS — G309 Alzheimer's disease, unspecified: Secondary | ICD-10-CM

## 2013-01-23 LAB — BASIC METABOLIC PANEL
BUN: 16 mg/dL (ref 6–23)
CO2: 32 mEq/L (ref 19–32)
Chloride: 103 mEq/L (ref 96–112)
Creatinine, Ser: 1.12 mg/dL — ABNORMAL HIGH (ref 0.50–1.10)
GFR calc Af Amer: 51 mL/min — ABNORMAL LOW (ref >90)
Potassium: 3.8 mEq/L (ref 3.5–5.1)

## 2013-01-23 LAB — CBC
HCT: 40.8 % (ref 36.0–46.0)
MCV: 89.3 fL (ref 78.0–100.0)
Platelets: 190 10*3/uL (ref 150–400)
RBC: 4.57 MIL/uL (ref 3.87–5.11)
RDW: 15.3 % (ref 11.5–15.5)
WBC: 7.7 10*3/uL (ref 4.0–10.5)

## 2013-01-23 LAB — GLUCOSE, CAPILLARY: Glucose-Capillary: 118 mg/dL — ABNORMAL HIGH (ref 70–99)

## 2013-01-23 LAB — URINE CULTURE

## 2013-01-23 MED ORDER — ENOXAPARIN SODIUM 40 MG/0.4ML ~~LOC~~ SOLN
40.0000 mg | SUBCUTANEOUS | Status: DC
  Administered 2013-01-23: 40 mg via SUBCUTANEOUS
  Filled 2013-01-23: qty 0.4

## 2013-01-23 MED ORDER — ASPIRIN EC 325 MG PO TBEC: 325.0000 mg | DELAYED_RELEASE_TABLET | Freq: Every day | ORAL | Status: DC

## 2013-01-23 MED ORDER — CEFUROXIME AXETIL 500 MG PO TABS: 500.0000 mg | ORAL_TABLET | Freq: Two times a day (BID) | ORAL | Status: DC

## 2013-01-23 MED ORDER — INSULIN GLARGINE 100 UNIT/ML ~~LOC~~ SOLN: 10.0000 [IU] | Freq: Every day | SUBCUTANEOUS | Status: DC

## 2013-01-23 NOTE — Procedures (Signed)
EEG NUMBER:  HISTORY:  An 79 year old female with inability to speak and right-sided weakness.  MEDICATIONS:  Aricept, Norvasc, Lexapro, NovoLog, Lantus, Lipitor, Rocephin.  CONDITIONS OF RECORDING:  This is a 16-channel EEG carried out with the patient in the drowsy and asleep states.  DESCRIPTION:  The background activity as it is continuous and a mixture of poorly organized delta and theta activity.  Alpha activity that is seen is infrequent and poorly sustained.  Theta activity is most prominent.  No epileptiform activity is noted.  No evidence of stage II sleep is noted.  Hypoventilation, intermittent photic stimulation were not performed.  IMPRESSION:  This EEG is characterized by general background slowing. Although, this may be seen with drowse, cannot rule out the possibility of diffuse disturbance that is etiologically nonspecific, but may include a metabolic encephalopathy among other possibilities.  No epileptiform activity is noted.          ______________________________ Thana Farr, MD    XB:JYNW D:  01/22/2013 19:44:40  T:  01/23/2013 19:07:16  Job #:  [id number]

## 2013-01-23 NOTE — Clinical Social Work Note (Signed)
CSW spoke with pt's dtr, Melina Schools [phone], who reports plan is for pt to return to [facility] upon d/c. CSW will continue to follow.  Dellie Burns, MSW, [place] [phone] (Coverage)

## 2013-01-23 NOTE — Discharge Summary (Signed)
Physician Discharge Summary  [name] ZOX:[id number] DOB: 1927/03/08 DOA: 01/21/2013  PCP: No primary provider on file.  Admit date: 01/21/2013 Discharge date: 01/23/2013  Time spent: >30 minutes  Recommendations for Outpatient Follow-up:      Follow-up Information   Please follow up. (SNF MD in 1-2days)       Follow up with Gates Rigg, MD. (in 3-4weeks, call for appt upon discharge)    Contact information:   [address] [place] [address] [phone]      Pending studies-EEG, patient to follow up with nursing home M.D. for results.  Discharge Diagnoses:  Principal Problem:   CVA (cerebral vascular accident) Active Problems:   Alzheimer's dementia   Arthritis   CKD (chronic kidney disease)   Dysphagia   HTN (hypertension)   Hypercholesterolemia   Discharge Condition: Stable  Diet recommendation: Dysphagia 3 diet  Filed Weights   01/21/13 2255  Weight: 75.978 kg (167 lb 8 oz)    History of present illness:  [name] is a right handed 79 y.o. female with multiple medical problems including prior CVA, HTN, DM, hypercholesterolemia, Dementia, arthritis, NH Resident, non-ambulatory, presents to the ER as a code stroke with inability to speak, right facial droop and right sided weakness. Dr Thad Ranger of neurology saw her and determined that she was not a thrombolytic candidate due to the inexact time of Ictus, along with poor functional baseline. Evaluation in the ER also included a UA that is positive for infection, head CT showed microvascular disease, but no acute process, her BS is OK, and she has normal hemoglobin. Her WBC is 12K. Hospitalist was asked to admit her for completing stroke work up.   Hospital Course:  1-acute CVA: patient with underline dementia making difficult neurologic exam.  -patient was on ASA prior to event; will switch to plavix for secondary prevention as she pass now swallowing eval  -MRI was done and showed  Acute small non hemorrhagic right cerebellar infarct. Acute small to slightly moderate sized non hemorrhagic infarct anterior left opercular region/frontal lobe. Remote tiny thalamic and basal ganglia infarcts. carotid dopplers was done and showed mild plaque disease bilaterally. and 2-D echo showed an EF of 50-55% with normal wall motion. Grade 2 diastolic dysfunction noted. No embolic source reported. -An EEG was ordered and the results are pending at the time of this dictation, discussed with neurologist-Dr. Pearlean Brownie who recommends to discharge patient back to nursing facility and have her followup with nursing home M.D. for results and outpatient followup with neuro -Dr. Pearlean Brownie states she spoke with patient's family today and recommended not to do a TEE because of her poor baseline. Per discussion with Dr. Pearlean Brownie today he recommends to change her preadmission aspirin to 325 mg daily, and to discontinue the Plavix that was started in the hospital. She is to continue aspirin as ready mentioned, and Zocor upon discharge and followup outpatient. -Patient to be discharged back to nursing home at this time for continued PT OT  2-Escherichia coli UTI: Urine culture came back positive for Escherichia coli, she has been on Rocephin in the hospital, we'll discharge on Ceftin to complete a treatment course of 7 days. The Escherichia coli sensitivities are pending at this time, she is to followup with nursing home M.D. 3-HLD: continue statins  4-Diabetes: A1c was done in the hospital came back at 6.6, she was placed on Lantus 10 units in the sugars have been controlled on this, she is to followup with  nursing home M.D. for continued monitoring of her sugars and adjustment of her Lantus dose as clinically appropriate for optimal blood glucose control. 5-dementia: continue namenda     Consultants:  neurology Procedures:  See below for imaging studies  Discharge Exam: Filed Vitals:   01/22/13 1816 01/22/13 2251  01/23/13 0218 01/23/13 0635  BP: 113/87 136/57 137/57 157/66  Pulse: 57 50 52 55  Temp: 99.2 F (37.3 C) 98.8 F (37.1 C) 98 F (36.7 C) 98.3 F (36.8 C)  TempSrc: Oral Oral Oral Oral  Resp: 20 18 16 16   Height:      Weight:      SpO2: 100% 100% 100% 100%   Exam:  General: Awake, afebrile, moves limbs spontaneously.  Cardiovascular: S1 and S2, no rubs or gallosp  Respiratory:CTA bilaterally  Abdomen: soft, NT, ND  Musculoskeletal: no edema  Neuro: at baseline unable to walk, needs assistance with ADL's. Patient with facial droop, awake, able to follow some commands, no nystagmus   Discharge Instructions  Discharge Orders   Future Orders Complete By Expires     DIET DYS 3  As directed     Increase activity slowly  As directed         Medication List    STOP taking these medications       aspirin 81 MG chewable tablet      TAKE these medications       acetaminophen 325 MG tablet  Commonly known as:  TYLENOL  Take 650 mg by mouth every 6 (six) hours as needed for pain.     amLODipine 10 MG tablet  Commonly known as:  NORVASC  Take 10 mg by mouth daily.     aspirin EC 325 MG tablet  Take 1 tablet (325 mg total) by mouth daily.     BIOFREEZE EX  Apply 1 application topically 2 (two) times daily as needed (for neck and shoulder pain).     cefUROXime 500 MG tablet  Commonly known as:  CEFTIN  Take 1 tablet (500 mg total) by mouth 2 (two) times daily.     cetirizine 10 MG tablet  Commonly known as:  ZYRTEC  Take 10 mg by mouth daily.     donepezil 10 MG disintegrating tablet  Commonly known as:  ARICEPT ODT  Take 10 mg by mouth at bedtime.     escitalopram 10 MG tablet  Commonly known as:  LEXAPRO  Take 10 mg by mouth daily.     esomeprazole 40 MG packet  Commonly known as:  NEXIUM  Take 40 mg by mouth daily before breakfast.     furosemide 20 MG tablet  Commonly known as:  LASIX  Take 60 mg by mouth daily.     Ginkgo Biloba 40 MG Tabs  Take 120  mg by mouth at bedtime.     glucosamine-chondroitin 500-400 MG tablet  Take 2 tablets by mouth daily.     hydrocortisone 2.5 % rectal cream  Commonly known as:  ANUSOL-HC  Place 1 application rectally 2 (two) times daily as needed for hemorrhoids.     insulin aspart 100 UNIT/ML injection  Commonly known as:  novoLOG  Inject 2-14 Units into the skin 4 (four) times daily -  before meals and at bedtime. Sliding Scale 151-200=2units; 201-250=4units; 251-300=6units; 301-350=8units; 351-400=10units; 401-450=12 units; greater than 450=14units     insulin glargine 100 UNIT/ML injection  Commonly known as:  LANTUS  Inject 0.1 mLs (10 Units total) into the skin  at bedtime.     ipratropium-albuterol 0.5-2.5 (3) MG/3ML Soln  Commonly known as:  DUONEB  Take 3 mLs by nebulization as needed (shortness or wheezing of breath).     lisinopril 40 MG tablet  Commonly known as:  PRINIVIL,ZESTRIL  Take 40 mg by mouth daily.     metoprolol succinate 100 MG 24 hr tablet  Commonly known as:  TOPROL-XL  Take 100 mg by mouth daily. Take with or immediately following a meal.     multivitamin with minerals Tabs  Take 1 tablet by mouth daily.     NAMENDA XR 28 MG Cp24  Generic drug:  Memantine HCl ER  Take 28 mg by mouth daily.     potassium chloride 20 MEQ/15ML (10%) solution  Take 10 mEq by mouth daily.     protein supplement Powd  Take 1 scoop by mouth 3 (three) times daily with meals.     simvastatin 40 MG tablet  Commonly known as:  ZOCOR  Take 40 mg by mouth daily.       No Known Allergies     Follow-up Information   Please follow up. (SNF MD in 1-2days)       Follow up with Gates Rigg, MD. (in 3-4weeks, call for appt upon discharge)    Contact information:   [address] [place] [address] [phone]        The results of significant diagnostics from this hospitalization (including imaging, microbiology, ancillary and laboratory) are listed below  for reference.    Significant Diagnostic Studies: Ct Head Wo Contrast  01/21/2013   **ADDENDUM** CREATED: 01/21/2013 19:02:44  Dr. Thad Ranger was paged three separate times but did not return call.  Therefore, these results were given to Dr. Freida Busman at 7:05 p.m.  **END ADDENDUM** SIGNED BY: Aubery Lapping. [place], M.D.  01/21/2013   *RADIOLOGY REPORT*  Clinical Data: Code stroke.  Decreased mental status.  Left gaze. Right droop.  CT HEAD WITHOUT CONTRAST  Technique:  Contiguous axial images were obtained from the base of the skull through the vertex without contrast.  Comparison: 12/06/2010  Findings: There is atrophy and chronic small vessel disease changes. No acute intracranial abnormality.  Specifically, no hemorrhage, hydrocephalus, mass lesion, acute infarction, or significant intracranial injury.  No acute calvarial abnormality. Visualized paranasal sinuses and mastoids clear.  Orbital soft tissues unremarkable.  IMPRESSION: No acute intracranial abnormality.  Atrophy, chronic microvascular disease.  Critical Value/emergent results were called by telephone at the time of interpretation on 01/22/2012 at 6:36 p.m. to Dr. Thad Ranger, who verbally acknowledged these results.   Original Report Authenticated By: Charlett Nose, M.D.   Mri Brain Without Contrast  01/23/2013   *RADIOLOGY REPORT*  Clinical Data:  Aphasia.  Diabetic hypertensive patient with hyperlipidemia.  MRI BRAIN WITHOUT CONTRAST MRA HEAD WITHOUT CONTRAST  Technique: Multiplanar, multiecho pulse sequences of the brain and surrounding structures were obtained according to standard protocol without intravenous contrast.  Angiographic images of the head were obtained using MRA technique without contrast.  Comparison: 01/21/2013 CT.  MRI HEAD  Findings:  Acute small non hemorrhagic right cerebellar infarct.  Acute small to slightly moderate sized non hemorrhagic infarct anterior left opercular region/frontal lobe.  Remote tiny thalamic and basal ganglia  infarcts.  Scattered blood breakdown products most consistent with result of prior hemorrhagic ischemia.  Prominent confluent small vessel disease type changes periventricular region.  Global atrophy.  Ventricular prominence.  This may be related atrophy rather than hydrocephalus.  Major intracranial  vascular structures are patent.  Turbulent flow within the jugular bulb bilaterally felt to be an incidental finding.  No intracranial mass lesion detected on this unenhanced exam.  Cervical medullary junction, pituitary region and pineal region as well as orbital structures unremarkable.  IMPRESSION: Acute small non hemorrhagic right cerebellar infarct.  Acute small to slightly moderate sized non hemorrhagic infarct anterior left opercular region/frontal lobe.  MRA HEAD  Findings: Motion degraded exam.  Evaluating for aneurysm or grading stenosis accurately is therefore slightly limited.  Motion artifact cavernous and pre cavernous segment of the internal carotid artery bilaterally.  Suggestion of moderate narrowing at the junction of the left internal carotid artery cavernous and supraclinoid segment.  Mild to moderate narrowing left carotid terminus.  Mild to moderate narrowing distal M1 segment left middle cerebral artery.  Middle cerebral artery moderate branch vessel irregularity.  Ectatic vertebral arteries and basilar artery.  Left vertebral artery is dominant.  Ectatic basilar artery with artifact limiting evaluation.  PICA irregularity bilaterally.  Nonvisualization AICA.  Superior cerebellar artery and posterior cerebral artery mild to moderate branch vessel irregularity.  No obvious aneurysm or vascular malformation.  IMPRESSION: Motion degraded examination with intracranial atherosclerotic type changes most notable involving branch vessels as detailed above.  This has been made a PRA call report utilizing dashboard call feature.   Original Report Authenticated By: Lacy Duverney, M.D.   Mr Mra Head/brain Wo  Cm  01/23/2013   *RADIOLOGY REPORT*  Clinical Data:  Aphasia.  Diabetic hypertensive patient with hyperlipidemia.  MRI BRAIN WITHOUT CONTRAST MRA HEAD WITHOUT CONTRAST  Technique: Multiplanar, multiecho pulse sequences of the brain and surrounding structures were obtained according to standard protocol without intravenous contrast.  Angiographic images of the head were obtained using MRA technique without contrast.  Comparison: 01/21/2013 CT.  MRI HEAD  Findings:  Acute small non hemorrhagic right cerebellar infarct.  Acute small to slightly moderate sized non hemorrhagic infarct anterior left opercular region/frontal lobe.  Remote tiny thalamic and basal ganglia infarcts.  Scattered blood breakdown products most consistent with result of prior hemorrhagic ischemia.  Prominent confluent small vessel disease type changes periventricular region.  Global atrophy.  Ventricular prominence.  This may be related atrophy rather than hydrocephalus.  Major intracranial vascular structures are patent.  Turbulent flow within the jugular bulb bilaterally felt to be an incidental finding.  No intracranial mass lesion detected on this unenhanced exam.  Cervical medullary junction, pituitary region and pineal region as well as orbital structures unremarkable.  IMPRESSION: Acute small non hemorrhagic right cerebellar infarct.  Acute small to slightly moderate sized non hemorrhagic infarct anterior left opercular region/frontal lobe.  MRA HEAD  Findings: Motion degraded exam.  Evaluating for aneurysm or grading stenosis accurately is therefore slightly limited.  Motion artifact cavernous and pre cavernous segment of the internal carotid artery bilaterally.  Suggestion of moderate narrowing at the junction of the left internal carotid artery cavernous and supraclinoid segment.  Mild to moderate narrowing left carotid terminus.  Mild to moderate narrowing distal M1 segment left middle cerebral artery.  Middle cerebral artery moderate  branch vessel irregularity.  Ectatic vertebral arteries and basilar artery.  Left vertebral artery is dominant.  Ectatic basilar artery with artifact limiting evaluation.  PICA irregularity bilaterally.  Nonvisualization AICA.  Superior cerebellar artery and posterior cerebral artery mild to moderate branch vessel irregularity.  No obvious aneurysm or vascular malformation.  IMPRESSION: Motion degraded examination with intracranial atherosclerotic type changes most notable involving branch vessels as detailed above.  This has been made a PRA call report utilizing dashboard call feature.   Original Report Authenticated By: Lacy Duverney, M.D.    Microbiology: Recent Results (from the past 240 hour(s))  URINE CULTURE     Status: None   Collection Time    01/21/13  8:10 PM      Result Value Range Status   Specimen Description URINE, CATHETERIZED   Final   Special Requests NONE   Final   Culture  Setup Time 01/21/2013 21:03   Final   Colony Count >=100,000 COLONIES/ML   Final   Culture ESCHERICHIA COLI   Final   Report Status PENDING   Incomplete     Labs: Basic Metabolic Panel:  Recent Labs Lab 01/21/13 1851 01/21/13 1913 01/23/13 0624  NA 143 144 145  K 4.1 4.0 3.8  CL 105 107 103  CO2 28  --  32  GLUCOSE 115* 117* 110*  BUN 27* 32* 16  CREATININE 1.23* 1.20* 1.12*  CALCIUM 10.0  --  9.6   Liver Function Tests:  Recent Labs Lab 01/21/13 1851  AST 36  ALT 33  ALKPHOS 129*  BILITOT 0.2*  PROT 8.0  ALBUMIN 3.7   No results found for this basename: LIPASE, AMYLASE,  in the last 168 hours No results found for this basename: AMMONIA,  in the last 168 hours CBC:  Recent Labs Lab 01/21/13 1851 01/21/13 1913 01/23/13 0624  WBC 12.1*  --  7.7  NEUTROABS 6.0  --   --   HGB 13.7 15.3* 13.2  HCT 40.9 45.0 40.8  MCV 88.1  --  89.3  PLT 208  --  190   Cardiac Enzymes:  Recent Labs Lab 01/21/13 1852  TROPONINI <0.30   BNP: BNP (last 3 results) No results found for  this basename: PROBNP,  in the last 8760 hours CBG:  Recent Labs Lab 01/22/13 2249 01/23/13 0018 01/23/13 0419 01/23/13 0804 01/23/13 1106  GLUCAP 147* 117* 77 122* 118*       Signed:  Sidonia Nutter C  [facility] 01/23/2013, 12:52 PM

## 2013-01-23 NOTE — Progress Notes (Signed)
Pt stable, IV removed, discharge orders received and education completed. Pt to be discharged to [facility] via [facility]. Andrej Spagnoli, [place] Marie

## 2013-01-23 NOTE — Clinical Social Work Psychosocial (Signed)
Clinical Social Work Department BRIEF PSYCHOSOCIAL ASSESSMENT 01/23/2013  Patient:  [name], [name]     Account Number:  [id number]     Admit date:  01/21/2013  Clinical Social Worker:  Skip Mayer  Date/Time:  01/23/2013 10:15 AM  Referred by:  Physician  Date Referred:  01/23/2013 Referred for  SNF Placement   Other Referral:   Interview type:  Other - See comment Other interview type:   Marylene Land at [facility]    PSYCHOSOCIAL DATA Living Status:  FACILITY Admitted from facility:  [facility] Level of care:  Skilled Nursing Facility Primary support name:  Unable to reach children listed Primary support relationship to patient:  CHILD, ADULT Degree of support available:   Unable to assess    CURRENT CONCERNS Current Concerns  Post-Acute Placement   Other Concerns:    SOCIAL WORK ASSESSMENT / PLAN CSW consult for SNF.  Pt admitted from [facility] and is able to return upon d/c per Marylene Land in admissions at [facility].  CSW unable to reach and family by phone and no family at bedside.  Pt mute and unable to participate in assessment at this time.  Will continue to attempt contact with pt's family.   Assessment/plan status:  Information/Referral to [facility] Other assessment/ plan:   Information/referral to community resources:   [facility]  SNF    PATIENT'S/FAMILY'S RESPONSE TO PLAN OF CARE: Per Marylene Land at [facility], pt able to return at d/c.  Will continue to attempt contact with pt's family.       Dellie Burns, MSW, [place] [phone] (Coverage)

## 2013-01-23 NOTE — Progress Notes (Signed)
Stroke Team Progress Note  HISTORY [name] is an 79 y.o. female is a [facility] resident with a history of dementia who was in the cafeteria to eat this evening. Was noted to become fatigued acutely at 1745. Then became poorly responsive and seemed to be unable to move the right. EMS was called at that time and the patient was brought in as a code stroke. Initial NIHSS of 21.  Son was called. It seems that on yesterday when he visited his mother she did not seem herself and had some facial droop. At baseline the patient is non-ambulatory, requires set-up to eat, is bowel and bladder incontinent. Patient was not a TPA candidate secondary to inability to determine clear last know well. She was admitted for further evaluation and treatment.  SUBJECTIVE  Family in room   OBJECTIVE Most recent Vital Signs: Filed Vitals:   01/22/13 1816 01/22/13 2251 01/23/13 0218 01/23/13 0635  BP: 113/87 136/57 137/57 157/66  Pulse: 57 50 52 55  Temp: 99.2 F (37.3 C) 98.8 F (37.1 C) 98 F (36.7 C) 98.3 F (36.8 C)  TempSrc: Oral Oral Oral Oral  Resp: 20 18 16 16   Height:      Weight:      SpO2: 100% 100% 100% 100%   CBG (last 3)   Recent Labs  01/23/13 0018 01/23/13 0419 01/23/13 0804  GLUCAP 117* 77 122*    IV Fluid Intake:     MEDICATIONS  . amLODipine  10 mg Oral Daily  . atorvastatin  20 mg Oral Daily  . cefTRIAXone (ROCEPHIN)  IV  1 g Intravenous Q24H  . clopidogrel  75 mg Oral Q breakfast  . donepezil  10 mg Oral QHS  . escitalopram  10 mg Oral Daily  . furosemide  60 mg Oral Daily  . heparin  5,000 Units Subcutaneous Q8H  . insulin aspart  0-9 Units Subcutaneous Q4H  . insulin glargine  10 Units Subcutaneous QHS  . lisinopril  40 mg Oral Daily  . loratadine  10 mg Oral Daily  . Memantine HCl ER  28 mg Oral Daily  . metoprolol succinate  100 mg Oral Q breakfast  . multivitamin with minerals  1 tablet Oral Daily  . pantoprazole sodium  80 mg Per Tube Daily  . potassium  chloride  10 mEq Oral Daily  . protein supplement  1 scoop Oral TID WC   PRN:  acetaminophen, hydrocortisone  Diet:  Dysphagia 3 thin liquids Activity:   Bathroom privileges with assistance DVT Prophylaxis:  Heparin 5000 units sq tid   CLINICALLY SIGNIFICANT STUDIES Basic Metabolic Panel:   Recent Labs Lab 01/21/13 1851 01/21/13 1913 01/23/13 0624  NA 143 144 145  K 4.1 4.0 3.8  CL 105 107 103  CO2 28  --  32  GLUCOSE 115* 117* 110*  BUN 27* 32* 16  CREATININE 1.23* 1.20* 1.12*  CALCIUM 10.0  --  9.6   Liver Function Tests:   Recent Labs Lab 01/21/13 1851  AST 36  ALT 33  ALKPHOS 129*  BILITOT 0.2*  PROT 8.0  ALBUMIN 3.7   CBC:   Recent Labs Lab 01/21/13 1851 01/21/13 1913 01/23/13 0624  WBC 12.1*  --  7.7  NEUTROABS 6.0  --   --   HGB 13.7 15.3* 13.2  HCT 40.9 45.0 40.8  MCV 88.1  --  89.3  PLT 208  --  190   Coagulation:   Recent Labs Lab 01/21/13 1851  LABPROT 13.1  INR 1.00   Cardiac Enzymes:   Recent Labs Lab 01/21/13 1852  TROPONINI <0.30   Urinalysis:   Recent Labs Lab 01/21/13 2010  COLORURINE YELLOW  LABSPEC 1.018  PHURINE 6.5  GLUCOSEU NEGATIVE  HGBUR TRACE*  BILIRUBINUR NEGATIVE  KETONESUR 15*  PROTEINUR 30*  UROBILINOGEN 1.0  NITRITE POSITIVE*  LEUKOCYTESUR MODERATE*   URINE CULTURE: ECOLI  Lipid Panel    Component Value Date/Time   CHOL 146 01/22/2013 0500   TRIG 107 01/22/2013 0500   HDL 64 01/22/2013 0500   CHOLHDL 2.3 01/22/2013 0500   VLDL 21 01/22/2013 0500   LDLCALC 61 01/22/2013 0500   HgbA1C  Lab Results  Component Value Date   HGBA1C 6.6* 01/22/2013    Urine Drug Screen:     Component Value Date/Time   LABOPIA NONE DETECTED 01/21/2013 2010   COCAINSCRNUR NONE DETECTED 01/21/2013 2010   LABBENZ NONE DETECTED 01/21/2013 2010   AMPHETMU NONE DETECTED 01/21/2013 2010   THCU NONE DETECTED 01/21/2013 2010   LABBARB NONE DETECTED 01/21/2013 2010    Alcohol Level:   Recent Labs Lab 01/21/13 1851  ETH  <11   CT of the brain  01/21/2013  : No acute intracranial abnormality.  Atrophy, chronic microvascular disease.    MRI of the brainAcute small non hemorrhagic right cerebellar infarct. Acute small to slightly moderate sized non hemorrhagic infarct  anterior left opercular region/frontal lobe    MRA of the brain  Motion degraded examination with intracranial atherosclerotic type changes most notable involving branch vessels as detailed above.  2D Echocardiogram  55%, wall motion normal, LA size normal  Carotid Doppler  Technically difficult exam. Mild plaque disease bilaterally. On the right side, unable to visualize the ECA, bifurcation was high. The vessel identified appeared to be the ICA and the velocities were not elevated. No increased velocities noted on the left  CXR    EEG  EKG  normal sinus rhythm.   Therapy Recommendations   Physical Exam   Elderly African american lady not in distress.Awake alert. Afebrile. Head is nontraumatic. Neck is supple without bruit. Hearing is normal. Cardiac exam no murmur or gallop. Lungs are clear to auscultation. Distal pulses are well felt. Neurological Exam ;  awake alert severe expressive aphasia and speaks only a few words and nonsensical sentences. Able to understand and follow only simple midline commands. Extraocular movements are full range without nystagmus. Blinks to threat bilaterally. Face is symmetric without weakness. Tongue is midline. Motor system exam reveals no upper or lower eczema to drift however this appears to be mild right grip and finger tapping weakness on the right. Sensation withdraws to pain bilaterally. Both plantars are downgoing. Coordination difficult to test. Gait not tested ASSESSMENT [name] is a 79 y.o. female presenting with acute fatigue, right hemiparesis. She is not talking today. MRI imaging confirms small left MCA branch acute infarct.  . Suspect has baseline dementia., imaging pending.  On aspirin  81 mg orally every day prior to admission. Now on plavix 75mg  daily for secondary stroke prevention. Patient with global aphasia, right hemiparesis. Work up underway.   Baseline dementia  In SNF, w/c bound, help w/ all ADLs Hyperlipidemia, LDL 61, on zocor 40 PTA, on lipitor 20 now, at goal LDL < 100 Diabetes, HgbA1c pending, goal < 7.0 Urinary Tract Infection: [facility] day # 2  TREATMENT/PLAN  Continue aspirin suppository for secondary stroke prevention.  EEG pending, may review as outpatient. Have  patient make appt with Dr. Pearlean Brownie to see him in 2 months.  D/W family and answered questions. She is not a candidate for long-term anticoagulation given her significant underlying dementia hence will not pursue further workup for source of embolism-like TEE or prolonged cardiac telemetry.  Gwendolyn Lima. Manson Passey, [facility], MBA, MHA [facility] Pager: [phone] 01/23/2013 11:06 AM  I have personally obtained a history, examined the patient, evaluated imaging results, and formulated the assessment and plan of care. I agree with the above.  Delia Heady, MD

## 2013-01-24 ENCOUNTER — Telehealth: Payer: Self-pay | Admitting: Neurology

## 2013-01-28 ENCOUNTER — Non-Acute Institutional Stay (SKILLED_NURSING_FACILITY): Payer: Medicare Other | Admitting: Internal Medicine

## 2013-01-28 DIAGNOSIS — I639 Cerebral infarction, unspecified: Secondary | ICD-10-CM

## 2013-01-28 DIAGNOSIS — I150 Renovascular hypertension: Secondary | ICD-10-CM

## 2013-01-28 DIAGNOSIS — G309 Alzheimer's disease, unspecified: Secondary | ICD-10-CM

## 2013-01-28 DIAGNOSIS — I6350 Cerebral infarction due to unspecified occlusion or stenosis of unspecified cerebral artery: Secondary | ICD-10-CM

## 2013-01-28 DIAGNOSIS — E1059 Type 1 diabetes mellitus with other circulatory complications: Secondary | ICD-10-CM

## 2013-01-28 DIAGNOSIS — F0280 Dementia in other diseases classified elsewhere without behavioral disturbance: Secondary | ICD-10-CM

## 2013-02-18 ENCOUNTER — Non-Acute Institutional Stay (SKILLED_NURSING_FACILITY): Payer: Medicare Other | Admitting: Internal Medicine

## 2013-02-18 DIAGNOSIS — E7800 Pure hypercholesterolemia, unspecified: Secondary | ICD-10-CM

## 2013-02-18 DIAGNOSIS — E1059 Type 1 diabetes mellitus with other circulatory complications: Secondary | ICD-10-CM

## 2013-02-18 DIAGNOSIS — I150 Renovascular hypertension: Secondary | ICD-10-CM | POA: Insufficient documentation

## 2013-02-18 DIAGNOSIS — E1051 Type 1 diabetes mellitus with diabetic peripheral angiopathy without gangrene: Secondary | ICD-10-CM | POA: Insufficient documentation

## 2013-02-18 DIAGNOSIS — I2510 Atherosclerotic heart disease of native coronary artery without angina pectoris: Secondary | ICD-10-CM

## 2013-02-18 NOTE — Progress Notes (Signed)
Patient ID: JAID QUIRION, female   DOB: 08/19/1927, 79 y.o.   MRN: [id number]        HISTORY & PHYSICAL  DATE: 01/28/2013   FACILITY: [facility]  LEVEL OF CARE: SNF (31)  ALLERGIES:  No Known Allergies  CHIEF COMPLAINT:  Manage CVA, Alzheimer's dementia, and diabetes mellitus.    HISTORY OF PRESENT ILLNESS:  The patient is an 79 year-old, African-American female.    CVA: The patient was hospitalized with acute stroke symptoms with inability to speak, right facial droop, and right-sided weakness.  Neurology deemed that she was not a candidate for thrombolytic due to the time and poor functional status.  Her aspirin was switched to Plavix.  CT of the head did not show acute findings.   MRI showed acute small nonhemorrhagic 5 cm infarct.  Acute small to slightly moderate sized nonhemorrhagic infarct, anterior left opercular region/frontal lobe.  Remote tiny basal ganglia infarcts.  Carotid dopplers showed mild clot disease bilaterally.  2D-echo showed EF of 50-55% with normal wall motion.   Grade 2 diastolic dysfunction.  No embolic source.  The patient's CVA remains stable.  Patient denies new neurologic symptoms such as numbness, tingling, weakness, speech difficulties or visual disturbances.  No complications reported from the medications currently being used.  Patient overall is a poor historian.    DEMENTIA: The dementia remains stable and continues to function adequately in the current living environment with supervision.  The patient has had little changes in behavior. No complications noted from the medications presently being used. Patient is a poor historian.    DM:pt's DM remains stable.  Pt denies polyuria, polydipsia, polyphagia, changes in vision or hypoglycemic episodes.  No complications noted from the medication presently being used.  Last hemoglobin A1c is:  6.6.  She was started on Lantus.    PAST MEDICAL HISTORY :  Past Medical History  Diagnosis Date  .  CVD (cerebrovascular disease)   . Hypertension   . Diabetes mellitus without complication   . High cholesterol   . Alzheimer's dementia   . Arthritis     PAST SURGICAL HISTORY: No past surgical history on file.  SOCIAL HISTORY:  reports that she has never smoked. She does not have any smokeless tobacco history on file. She reports that she does not drink alcohol or use illicit drugs.  FAMILY HISTORY:  No family history on file.  CURRENT MEDICATIONS: Reviewed per [facility]  REVIEW OF SYSTEMS:  Unobtainable due to dementia.    PHYSICAL EXAMINATION  VS:  T 98.3       P 55     RR 16      BP 137/57     POX 100% room air       WT (Lb)  GENERAL: no acute distress, moderately obese body habitus EYES: conjunctivae normal, sclerae normal, normal eye lids MOUTH/THROAT: lips without lesions,no lesions in the mouth,tongue is without lesions,uvula elevates in midline NECK: supple, trachea midline, no neck masses, no thyroid tenderness, no thyromegaly LYMPHATICS: no LAN in the neck, no supraclavicular LAN RESPIRATORY: breathing is even & unlabored, BS CTAB CARDIAC: RRR, no murmur,no extra heart sounds, no edema GI:  ABDOMEN: abdomen soft, normal BS, no masses, no tenderness  LIVER/SPLEEN: no hepatomegaly, no splenomegaly MUSCULOSKELETAL: HEAD: normal to inspection & palpation BACK: no kyphosis, scoliosis or spinal processes tenderness EXTREMITIES: LEFT UPPER EXTREMITY: strength decreased, range of motion minimal  RIGHT UPPER EXTREMITY: strength decreased, range of motion minimal  LEFT  LOWER EXTREMITY: strength decreased, range of motion unable to perform  RIGHT LOWER EXTREMITY: strength decreased, range of motion unable to perform  PSYCHIATRIC: the patient is alert & oriented to person, affect & behavior appropriate  LABS/RADIOLOGY: Glucose 110, creatinine 1.12, otherwise BMP normal.    Alkaline phosphatase 129, otherwise liver profile normal.   CBC normal.  Troponin-I less than 0.03.     PT 13.1, INR 1.    Fasting lipid panel normal.    Urine drug screen negative.    Alcohol level less than 11.    12/2012:  Fasting lipid panel normal.   Albumin 3.4, otherwise liver profile normal.   ASSESSMENT/PLAN:  CVA.  Continue supportive care.  Alzheimer's dementia.  Stable.   Diabetes mellitus.  Well controlled.    Hypertension.  Well controlled.   Chronic kidney disease.  Reassess renal functions.   Hyperlipidemia.  Well controlled.    UTI.   Currently on Ceftin.   Check CBC and BMP.   I have reviewed patient's medical records received at admission/from hospitalization.  CPT CODE: 29562

## 2013-02-18 NOTE — Progress Notes (Signed)
PROGRESS NOTE  DATE: 02-18-13  FACILITY: [facility]  LEVEL OF CARE: SNF  Routine Visit  CHIEF COMPLAINT:  Manage diabetes mellitus and coronary artery disease  HISTORY OF PRESENT ILLNESS:  REASSESSMENT OF ONGOING PROBLEM(S):  1.DM:pt's DM remains stable.  Staff deny polyuria, polydipsia, polyphagia, changes in vision or hypoglycemic episodes.  No complications noted from the medication presently being used.  Last hemoglobin A1c is: 7 in 4/14, 8.1 in 2/14.  Patient is a poor historian.  2. CAD: The angina has been stable. The staff deny dyspnea on exertion, orthopnea, pedal edema, palpitations and paroxysmal nocturnal dyspnea. No complications noted from the medication presently being used.  PAST MEDICAL HISTORY : Reviewed.  No changes.  CURRENT MEDICATIONS: Reviewed per [facility]  REVIEW OF SYSTEMS: Unobtainable due to dementia.  PHYSICAL EXAMINATION  VS:  T 97.5      P 67      RR 20      BP 104/71    POX %     WT (Lb) 175  GENERAL: no acute distress, moderately obese body habitus NECK: supple, trachea midline, no neck masses, no thyroid tenderness, no thyromegaly RESPIRATORY: breathing is even & unlabored, BS CTAB CARDIAC: RRR, no murmur,no extra heart sounds, +1 bilateral lower extremity edema GI: abdomen soft, normal BS, no masses, no tenderness, no hepatomegaly, no splenomegaly PSYCHIATRIC: the patient is alert & disoriented, affect & behavior appropriate  LABS/RADIOLOGY:  5/14 CBC normal, glucose 132, creatinine 1.29 otherwise BMP normal 4/14 glucose 112, creatinine 1.3 otherwise BMP normal, albumin 3.4 otherwise liver profile normal, fasting lipid panel normal 3/14 hemoglobin 11.1, hemoglobin 10.6 MCV 89 otherwise CBC normal 11/13 creatinine 1.02 otherwise BMP normal 10/13 alkaline phosphatase 111 otherwise CMP normal, total cholesterol 200, LDL 113 triglycerides 69 HDL 73  ASSESSMENT/PLAN: 1. diabetes mellitus-stable. 2. CAD-stable. 3.  hypertension-well-controlled. 4. hyperlipidemia-well controlled. 5. dementia-stable. 6. GERD-well-controlled.  CPT CODE: 45409

## 2013-03-25 ENCOUNTER — Non-Acute Institutional Stay (SKILLED_NURSING_FACILITY): Payer: Medicare Other | Admitting: Internal Medicine

## 2013-03-25 DIAGNOSIS — I2510 Atherosclerotic heart disease of native coronary artery without angina pectoris: Secondary | ICD-10-CM

## 2013-03-25 DIAGNOSIS — E1059 Type 1 diabetes mellitus with other circulatory complications: Secondary | ICD-10-CM

## 2013-03-25 DIAGNOSIS — E7800 Pure hypercholesterolemia, unspecified: Secondary | ICD-10-CM

## 2013-03-25 DIAGNOSIS — I10 Essential (primary) hypertension: Secondary | ICD-10-CM

## 2013-03-25 NOTE — Progress Notes (Signed)
PROGRESS NOTE  DATE: 03-25-13  FACILITY: [facility]  LEVEL OF CARE: SNF  Routine Visit  CHIEF COMPLAINT:  Manage diabetes mellitus and coronary artery disease  HISTORY OF PRESENT ILLNESS:  REASSESSMENT OF ONGOING PROBLEM(S):  1.DM:pt's DM remains stable.  Staff deny polyuria, polydipsia, polyphagia, changes in vision or hypoglycemic episodes.  No complications noted from the medication presently being used.  Last hemoglobin A1c is: 7 in 4/14, 8.1 in 2/14.  Patient is a poor historian.  2. CAD: The angina has been stable. The staff deny dyspnea on exertion, orthopnea, pedal edema, palpitations and paroxysmal nocturnal dyspnea. No complications noted from the medication presently being used.  PAST MEDICAL HISTORY : Reviewed.  No changes.  CURRENT MEDICATIONS: Reviewed per [facility]  REVIEW OF SYSTEMS: Unobtainable due to dementia.  PHYSICAL EXAMINATION  VS:  T 99     P 71      RR 14      BP 150/96    POX %     WT (Lb) 168  GENERAL: no acute distress, moderately obese body habitus NECK: supple, trachea midline, no neck masses, no thyroid tenderness, no thyromegaly RESPIRATORY: breathing is even & unlabored, BS CTAB CARDIAC: RRR, no murmur,no extra heart sounds, +1 bilateral lower extremity edema GI: abdomen soft, normal BS, no masses, no tenderness, no hepatomegaly, no splenomegaly PSYCHIATRIC: the patient is alert & disoriented, affect & behavior appropriate  LABS/RADIOLOGY:  5/14 CBC normal, glucose 132, creatinine 1.29 otherwise BMP normal 4/14 glucose 112, creatinine 1.3 otherwise BMP normal, albumin 3.4 otherwise liver profile normal, fasting lipid panel normal 3/14 hemoglobin 11.1, hemoglobin 10.6 MCV 89 otherwise CBC normal 11/13 creatinine 1.02 otherwise BMP normal 10/13 alkaline phosphatase 111 otherwise CMP normal, total cholesterol 200, LDL 113 triglycerides 69 HDL 73  ASSESSMENT/PLAN: 1. diabetes mellitus-stable. 2. CAD-stable. 3. hypertension-blood pressure  elevated. Will review of log. 4. hyperlipidemia-well controlled. 5. dementia-stable. 6. GERD-well-controlled.  CPT CODE: 16109

## 2013-04-03 ENCOUNTER — Ambulatory Visit (INDEPENDENT_AMBULATORY_CARE_PROVIDER_SITE_OTHER): Payer: Medicare Other | Admitting: Nurse Practitioner

## 2013-04-03 ENCOUNTER — Encounter: Payer: Self-pay | Admitting: Nurse Practitioner

## 2013-04-03 VITALS — BP 130/72 | HR 54 | Temp 98.6°F

## 2013-04-03 DIAGNOSIS — I6350 Cerebral infarction due to unspecified occlusion or stenosis of unspecified cerebral artery: Secondary | ICD-10-CM

## 2013-04-03 DIAGNOSIS — I639 Cerebral infarction, unspecified: Secondary | ICD-10-CM

## 2013-04-03 NOTE — Progress Notes (Signed)
[facility]  PATIENT: [name] DOB: June 01, 1927   HISTORY FROM: chart (pt. With advanced dementia) REASON FOR VISIT: stroke follow up   HISTORICAL  CHIEF COMPLAINT:  Chief Complaint  Patient presents with  . Follow-up    Stroke #    HISTORY OF PRESENT ILLNESS: UPDATE 04/03/13 (LL): Patient is brought to office for follow up accompanied only by NH driver.  Speech has improved,  Speaks short sentences. Denies pain. Unable to give history. Wheelchair bound.  [name] is an 79 y.o. female is a NH resident with a history of dementia.  Was noted to become fatigued acutely at 1745 on 01/21/13. Then became poorly responsive and seemed to be unable to move the right. EMS was called at that time and the patient was brought in as a code stroke. Initial NIHSS of 21.  Son was called. It seems that on yesterday when he visited his mother she did not seem herself and had some facial droop. At baseline the patient is non-ambulatory, requires set-up to eat, is bowel and bladder incontinent. Patient was not a TPA candidate secondary to inability to determine clear last know well. She was admitted for further evaluation and treatment.   REVIEW OF SYSTEMS: Full 14 system review of systems performed and notable only for:  Constitutional: N/A  Cardiovascular: N/A  Ear/Nose/Throat: N/A  Skin: N/A  Eyes: N/A  Respiratory: N/A  Gastroitestinal: N/A  Hematology/Lymphatic: N/A  Endocrine: N/A Musculoskeletal:N/A  Allergy/Immunology: N/A  Neurological: N/A Psychiatric: N/A   ALLERGIES: No Known Allergies  HOME MEDICATIONS: Outpatient Prescriptions Prior to Visit  Medication Sig Dispense Refill  . acetaminophen (TYLENOL) 325 MG tablet Take 650 mg by mouth every 6 (six) hours as needed for pain.      Marland Kitchen amLODipine (NORVASC) 10 MG tablet Take 10 mg by mouth daily.      Marland Kitchen aspirin EC 325 MG tablet Take 1 tablet (325 mg total) by mouth daily.  30 tablet  0  . cefUROXime  (CEFTIN) 500 MG tablet Take 1 tablet (500 mg total) by mouth 2 (two) times daily.      . cetirizine (ZYRTEC) 10 MG tablet Take 10 mg by mouth daily.      Marland Kitchen donepezil (ARICEPT ODT) 10 MG disintegrating tablet Take 10 mg by mouth at bedtime.      Marland Kitchen escitalopram (LEXAPRO) 10 MG tablet Take 10 mg by mouth daily.      Marland Kitchen esomeprazole (NEXIUM) 40 MG packet Take 40 mg by mouth daily before breakfast.      . furosemide (LASIX) 20 MG tablet Take 60 mg by mouth daily.      . Ginkgo Biloba 40 MG TABS Take 120 mg by mouth at bedtime.      Marland Kitchen glucosamine-chondroitin 500-400 MG tablet Take 2 tablets by mouth daily.      . hydrocortisone (ANUSOL-HC) 2.5 % rectal cream Place 1 application rectally 2 (two) times daily as needed for hemorrhoids.      . insulin aspart (NOVOLOG) 100 UNIT/ML injection Inject 2-14 Units into the skin 4 (four) times daily -  before meals and at bedtime. Sliding Scale 151-200=2units; 201-250=4units; 251-300=6units; 301-350=8units; 351-400=10units; 401-450=12 units; greater than 450=14units      . insulin glargine (LANTUS) 100 UNIT/ML injection Inject 0.1 mLs (10 Units total) into the skin at bedtime.  10 mL  12  . ipratropium-albuterol (DUONEB) 0.5-2.5 (3) MG/3ML SOLN Take 3 mLs by nebulization as needed (shortness or wheezing of breath).      Marland Kitchen  lisinopril (PRINIVIL,ZESTRIL) 40 MG tablet Take 40 mg by mouth daily.      . Memantine HCl ER (NAMENDA XR) 28 MG CP24 Take 28 mg by mouth daily.      . Menthol, Topical Analgesic, (BIOFREEZE EX) Apply 1 application topically 2 (two) times daily as needed (for neck and shoulder pain).      . metoprolol succinate (TOPROL-XL) 100 MG 24 hr tablet Take 100 mg by mouth daily. Take with or immediately following a meal.      . Multiple Vitamin (MULTIVITAMIN WITH MINERALS) TABS Take 1 tablet by mouth daily.      . potassium chloride 20 MEQ/15ML (10%) solution Take 10 mEq by mouth daily.      . protein supplement (RESOURCE BENEPROTEIN) POWD Take 1 scoop by  mouth 3 (three) times daily with meals.      . simvastatin (ZOCOR) 40 MG tablet Take 40 mg by mouth daily.       No facility-administered medications prior to visit.   Marland Kitchen RESTASIS 0.05 % ophthalmic emulsion    Sig:   . GENTAK 0.3 % ophthalmic ointment    Sig:   . latanoprost (XALATAN) 0.005 % ophthalmic solution    Sig:      PAST MEDICAL HISTORY: Past Medical History  Diagnosis Date  . CVD (cerebrovascular disease)   . Hypertension   . Diabetes mellitus without complication   . High cholesterol   . Alzheimer's dementia   . Arthritis     PAST SURGICAL HISTORY: No past surgical history on file.  FAMILY HISTORY: No family history on file.  SOCIAL HISTORY: History   Social History  . Marital Status: Single    Spouse Name: N/A    Number of Children: N/A  . Years of Education: N/A   Occupational History  . Not on file.   Social History Main Topics  . Smoking status: Never Smoker   . Smokeless tobacco: Not on file  . Alcohol Use: No  . Drug Use: No  . Sexually Active: No   Other Topics Concern  . Not on file   Social History Narrative  . No narrative on file     PHYSICAL EXAM  Filed Vitals:   04/03/13 1353  BP: 130/72  Pulse: 54  Temp: 98.6 F (37 C)  TempSrc: Oral   There is no weight on file to calculate BMI.  Elderly African american lady not in distress.Awake alert. Afebrile.  Head is nontraumatic.  Neck is supple without bruit.  Hearing is normal.  Cardiac exam no murmur or gallop.  Lungs are clear to auscultation.  Distal pulses are well felt.   Neurological Exam ; Awake, alert, not oriented to age, place or time.  Speaks only a few words and short sentences. Able to understand and follow only simple midline commands. Extraocular movements are full range without nystagmus. Blinks to threat bilaterally. Face is symmetric without weakness. Tongue is midline. Motor system difficult to evaluate, moves RU, LL and RL extremities, minimal movement  of LUE.  Good right hand grip. Sensation withdraws to pain bilaterally. Coordination difficult to test. Gait not tested.  DIAGNOSTIC DATA (LABS, IMAGING, TESTING) - I reviewed patient records, labs, notes, testing and imaging myself where available.  Lab Results  Component Value Date   WBC 7.7 01/23/2013   HGB 13.2 01/23/2013   HCT 40.8 01/23/2013   MCV 89.3 01/23/2013   PLT 190 01/23/2013      Component Value Date/Time   NA 145  01/23/2013 0624   K 3.8 01/23/2013 0624   CL 103 01/23/2013 0624   CO2 32 01/23/2013 0624   GLUCOSE 110* 01/23/2013 0624   BUN 16 01/23/2013 0624   CREATININE 1.12* 01/23/2013 0624   CALCIUM 9.6 01/23/2013 0624   PROT 8.0 01/21/2013 1851   ALBUMIN 3.7 01/21/2013 1851   AST 36 01/21/2013 1851   ALT 33 01/21/2013 1851   ALKPHOS 129* 01/21/2013 1851   BILITOT 0.2* 01/21/2013 1851   GFRNONAA 44* 01/23/2013 0624   GFRAA 51* 01/23/2013 0624   Lab Results  Component Value Date   CHOL 146 01/22/2013   HDL 64 01/22/2013   LDLCALC 61 01/22/2013   TRIG 107 01/22/2013   CHOLHDL 2.3 01/22/2013   Lab Results  Component Value Date   HGBA1C 6.6* 01/22/2013   Lab Results  Component Value Date   VITAMINB12 507 12/08/2010   Lab Results  Component Value Date   TSH 1.000 12/08/2010    CT of the brain 01/21/2013 : No acute intracranial abnormality. Atrophy, chronic microvascular disease.  MRI of the brain Acute small non hemorrhagic right cerebellar infarct. Acute small to slightly moderate sized non hemorrhagic infarct anterior left opercular region/frontal lobe  MRA of the brain Motion degraded examination with intracranial atherosclerotic type changes most notable involving branch vessels as detailed above.  2D Echocardiogram 55%, wall motion normal, LA size normal  Carotid Doppler Technically difficult exam. Mild plaque disease bilaterally. On the right side, unable to visualize the ECA, bifurcation was high. The vessel identified appeared to be the ICA and the velocities were not  elevated. No increased velocities noted on the left  EKG normal sinus rhythm.   ASSESSMENT AND PLAN [name] is a 79 y.o. female presenting with acute fatigue, right hemiparesis. MRI imaging confirms small left MCA branch acute infarct. Suspect has baseline dementia. Residual left arm weakness.  Continue aspirin 325mg  for secondary stroke prevention. Continue current dementia medications. She is not a candidate for long-term anticoagulation given her significant underlying dementia hence will not pursue further workup for source of embolism-like TEE or prolonged cardiac telemetry. No follow up appoitment needed.  Sabriah Hobbins NP-C 04/03/2013, 2:10 PM  [facility] [address], [address] [place], [place] [address] [phone]  up

## 2013-04-03 NOTE — Patient Instructions (Signed)
See Report of Consultation.  Continue Aspirin 325 mg daily.  No need for follow up appt.

## 2013-04-29 ENCOUNTER — Non-Acute Institutional Stay (SKILLED_NURSING_FACILITY): Payer: Medicare Other | Admitting: Internal Medicine

## 2013-04-29 DIAGNOSIS — E1159 Type 2 diabetes mellitus with other circulatory complications: Secondary | ICD-10-CM

## 2013-04-29 DIAGNOSIS — I10 Essential (primary) hypertension: Secondary | ICD-10-CM

## 2013-04-29 DIAGNOSIS — I2510 Atherosclerotic heart disease of native coronary artery without angina pectoris: Secondary | ICD-10-CM

## 2013-04-29 DIAGNOSIS — E7800 Pure hypercholesterolemia, unspecified: Secondary | ICD-10-CM

## 2013-05-04 DIAGNOSIS — E1159 Type 2 diabetes mellitus with other circulatory complications: Secondary | ICD-10-CM | POA: Insufficient documentation

## 2013-05-04 NOTE — Progress Notes (Signed)
PROGRESS NOTE  DATE: 04-29-13  FACILITY: [facility]  LEVEL OF CARE: SNF  Routine Visit  CHIEF COMPLAINT:  Manage HTN, diabetes mellitus and coronary artery disease  HISTORY OF PRESENT ILLNESS:  REASSESSMENT OF ONGOING PROBLEM(S):  DM:pt's DM is unstable.  Staff deny polyuria, polydipsia, polyphagia, changes in vision or hypoglycemic episodes.  No complications noted from the medication presently being used.  Last hemoglobin A1c is: 7 in 4/14, 8.1 in 2/14, in 7-14 hemoglobin A1c 8.1 Patient is a poor historian.  CAD: The angina has been stable. The staff deny dyspnea on exertion, orthopnea, pedal edema, palpitations and paroxysmal nocturnal dyspnea. No complications noted from the medication presently being used.  HTN: Pt 's HTN is unstable.  Denies CP, sob, DOE, pedal edema, headaches, dizziness or visual disturbances.  No complications from the medications currently being used.  Last BP : 150/96, 144/80  PAST MEDICAL HISTORY : Reviewed.  No changes.  CURRENT MEDICATIONS: Reviewed per [facility]  REVIEW OF SYSTEMS: Unobtainable due to dementia.  PHYSICAL EXAMINATION  VS:  T 96    P 60      RR 18     BP 150/96    POX %     WT (Lb) 169  GENERAL: no acute distress, moderately obese body habitus EYES: Normal sclerae, normal conjunctivae, no discharge NECK: supple, trachea midline, no neck masses, no thyroid tenderness, no thyromegaly LYMPHATICS: No cervical lymphadenopathy, no supraclavicular lymphadenopathy RESPIRATORY: breathing is even & unlabored, BS CTAB CARDIAC: RRR, no murmur,no extra heart sounds, +1 bilateral lower extremity edema GI: abdomen soft, normal BS, no masses, no tenderness, no hepatomegaly, no splenomegaly PSYCHIATRIC: the patient is alert & disoriented, affect & behavior appropriate  LABS/RADIOLOGY:  5/14 CBC normal, glucose 132, creatinine 1.29 otherwise BMP normal 4/14 glucose 112, creatinine 1.3 otherwise BMP normal, albumin 3.4 otherwise liver profile  normal, fasting lipid panel normal 3/14 hemoglobin 11.1, hemoglobin 10.6 MCV 89 otherwise CBC normal 11/13 creatinine 1.02 otherwise BMP normal 10/13 alkaline phosphatase 111 otherwise CMP normal, total cholesterol 200, LDL 113 triglycerides 69 HDL 73  ASSESSMENT/PLAN:  diabetes mellitus-stable. CAD-stable. hypertension-uncontrolled. Start Norvasc 5 mg daily hyperlipidemia-well controlled. dementia-stable. GERD-well-controlled.  CPT CODE: 62130

## 2013-05-10 ENCOUNTER — Other Ambulatory Visit: Payer: Self-pay | Admitting: *Deleted

## 2013-05-10 MED ORDER — AMBULATORY NON FORMULARY MEDICATION: Status: DC

## 2013-05-20 ENCOUNTER — Non-Acute Institutional Stay (SKILLED_NURSING_FACILITY): Payer: Medicare Other | Admitting: Internal Medicine

## 2013-05-20 DIAGNOSIS — I10 Essential (primary) hypertension: Secondary | ICD-10-CM

## 2013-05-27 ENCOUNTER — Non-Acute Institutional Stay (SKILLED_NURSING_FACILITY): Payer: Medicare Other | Admitting: Internal Medicine

## 2013-05-27 DIAGNOSIS — I2510 Atherosclerotic heart disease of native coronary artery without angina pectoris: Secondary | ICD-10-CM

## 2013-05-27 DIAGNOSIS — I150 Renovascular hypertension: Secondary | ICD-10-CM

## 2013-05-27 DIAGNOSIS — E1059 Type 1 diabetes mellitus with other circulatory complications: Secondary | ICD-10-CM

## 2013-05-27 DIAGNOSIS — E7800 Pure hypercholesterolemia, unspecified: Secondary | ICD-10-CM

## 2013-05-27 NOTE — Progress Notes (Signed)
PROGRESS NOTE  DATE: 05-27-13  FACILITY: [facility]  LEVEL OF CARE: SNF  Routine Visit  CHIEF COMPLAINT:  Manage HTN, diabetes mellitus and coronary artery disease  HISTORY OF PRESENT ILLNESS:  REASSESSMENT OF ONGOING PROBLEM(S):  DM:pt's DM is unstable.  Staff deny polyuria, polydipsia, polyphagia, changes in vision or hypoglycemic episodes.  No complications noted from the medication presently being used.  Last hemoglobin A1c is: 7 in 4/14, 8.1 in 2/14, in 7-14 hemoglobin A1c 8.1 Patient is a poor historian.  CAD: The angina has been stable. The staff deny dyspnea on exertion, orthopnea, pedal edema, palpitations and paroxysmal nocturnal dyspnea. No complications noted from the medication presently being used.  HTN: Pt 's HTN is unstable.  Denies CP, sob, DOE, pedal edema, headaches, dizziness or visual disturbances.  No complications from the medications currently being used.  Last BP : 150/96, 144/80, 156/70.  PAST MEDICAL HISTORY : Reviewed.  No changes.  CURRENT MEDICATIONS: Reviewed per [facility]  REVIEW OF SYSTEMS: Unobtainable due to dementia.  PHYSICAL EXAMINATION  VS:  T 97.3    P 68      RR 18     BP 156/70   POX %     WT (Lb) 174  GENERAL: no acute distress, moderately obese body habitus EYES: Normal sclerae, normal conjunctivae, no discharge NECK: supple, trachea midline, no neck masses, no thyroid tenderness, no thyromegaly LYMPHATICS: No cervical lymphadenopathy, no supraclavicular lymphadenopathy RESPIRATORY: breathing is even & unlabored, BS CTAB CARDIAC: RRR, no murmur,no extra heart sounds, +1 bilateral lower extremity edema GI: abdomen soft, normal BS, no masses, no tenderness, no hepatomegaly, no splenomegaly PSYCHIATRIC: the patient is alert & disoriented, affect & behavior appropriate  LABS/RADIOLOGY:  5/14 CBC normal, glucose 132, creatinine 1.29 otherwise BMP normal 4/14 glucose 112, creatinine 1.3 otherwise BMP normal, albumin 3.4 otherwise liver  profile normal, fasting lipid panel normal 3/14 hemoglobin 11.1, hemoglobin 10.6 MCV 89 otherwise CBC normal 11/13 creatinine 1.02 otherwise BMP normal 10/13 alkaline phosphatase 111 otherwise CMP normal, total cholesterol 200, LDL 113 triglycerides 69 HDL 73  ASSESSMENT/PLAN:  diabetes mellitus-stable. CAD-stable. hypertension-uncontrolled. Start hydralazine 15 mg 3 times a day hyperlipidemia-well controlled. dementia-stable. GERD-well-controlled.  CPT CODE: [address]

## 2013-06-14 NOTE — Progress Notes (Signed)
Patient ID: [name], female   DOB: 06/15/1927, 79 y.o.   MRN: [id number]        PROGRESS NOTE  DATE: 05/20/2013  FACILITY:  [facility]  LEVEL OF CARE: SNF (31)  Acute Visit  CHIEF COMPLAINT:  Manage hypertension.    HISTORY OF PRESENT ILLNESS: I was requested by the staff to assess the patient regarding above problem(s):  HTN: Pt 's HTN is unstable.  Denies CP, sob, DOE, pedal edema, headaches, dizziness or visual disturbances.  No complications from the medications currently being used.  Last BP :  156/70, 150/96.      PAST MEDICAL HISTORY : Reviewed.  No changes.  CURRENT MEDICATIONS: Reviewed per [facility]  REVIEW OF SYSTEMS:  GENERAL: no change in appetite, no fatigue, no weight changes, no fever, chills or weakness RESPIRATORY: no cough, SOB, DOE,, wheezing, hemoptysis CARDIAC: no chest pain, edema or palpitations GI: no abdominal pain, diarrhea, constipation, heart burn, nausea or vomiting  PHYSICAL EXAMINATION  GENERAL: no acute distress, morbidly obese body habitus NECK: supple, trachea midline, no neck masses, no thyroid tenderness, no thyromegaly RESPIRATORY: breathing is even & unlabored, BS CTAB CARDIAC: RRR, no murmur,no extra heart sounds EDEMA/VARICOSITIES:  +1 bilateral lower extremity edema  GI: abdomen soft, normal BS, no masses, no tenderness, no hepatomegaly, no splenomegaly PSYCHIATRIC: the patient is alert & oriented to person, affect & behavior appropriate  ASSESSMENT/PLAN:  Hypertension.  Uncontrolled problem.  Increase Norvasc to 10 mg q.d.    CPT CODE: 91478

## 2013-06-17 ENCOUNTER — Non-Acute Institutional Stay (SKILLED_NURSING_FACILITY): Payer: Medicare Other | Admitting: Internal Medicine

## 2013-06-17 DIAGNOSIS — E7800 Pure hypercholesterolemia, unspecified: Secondary | ICD-10-CM

## 2013-06-17 DIAGNOSIS — I10 Essential (primary) hypertension: Secondary | ICD-10-CM

## 2013-06-17 DIAGNOSIS — I2510 Atherosclerotic heart disease of native coronary artery without angina pectoris: Secondary | ICD-10-CM

## 2013-06-17 DIAGNOSIS — E1159 Type 2 diabetes mellitus with other circulatory complications: Secondary | ICD-10-CM

## 2013-06-23 NOTE — Progress Notes (Signed)
PROGRESS NOTE  DATE: 06-17-13  FACILITY: [facility]  LEVEL OF CARE: SNF  Routine Visit  CHIEF COMPLAINT:  Manage HTN, diabetes mellitus and coronary artery disease  HISTORY OF PRESENT ILLNESS:  REASSESSMENT OF ONGOING PROBLEM(S):  DM:pt's DM is unstable.  Staff deny polyuria, polydipsia, polyphagia, changes in vision or hypoglycemic episodes.  No complications noted from the medication presently being used.  Last hemoglobin A1c is: 7 in 4/14, 8.1 in 2/14, in 7-14 hemoglobin A1c 8.1 Patient is a poor historian.  CAD: The angina has been stable. The staff deny dyspnea on exertion, orthopnea, pedal edema, palpitations and paroxysmal nocturnal dyspnea. No complications noted from the medication presently being used.  HTN: Pt 's HTN is unstable.  Denies CP, sob, DOE, pedal edema, headaches, dizziness or visual disturbances.  No complications from the medications currently being used.  Last BP : 150/96, 144/80, 156/70, 110/68.  PAST MEDICAL HISTORY : Reviewed.  No changes.  CURRENT MEDICATIONS: Reviewed per [facility]  REVIEW OF SYSTEMS: Unobtainable due to dementia.  PHYSICAL EXAMINATION  VS:  T 97.9    P 70      RR 20     BP 110/68   POX %     WT (Lb) 174  GENERAL: no acute distress, moderately obese body habitus NECK: supple, trachea midline, no neck masses, no thyroid tenderness, no thyromegaly RESPIRATORY: breathing is even & unlabored, BS CTAB CARDIAC: RRR, no murmur,no extra heart sounds, +1 bilateral lower extremity edema GI: abdomen soft, normal BS, no masses, no tenderness, no hepatomegaly, no splenomegaly PSYCHIATRIC: the patient is alert & disoriented, affect & behavior appropriate  LABS/RADIOLOGY:  5/14 CBC normal, glucose 132, creatinine 1.29 otherwise BMP normal 4/14 glucose 112, creatinine 1.3 otherwise BMP normal, albumin 3.4 otherwise liver profile normal, fasting lipid panel normal 3/14 hemoglobin 11.1, hemoglobin 10.6 MCV 89 otherwise CBC normal 11/13  creatinine 1.02 otherwise BMP normal 10/13 alkaline phosphatase 111 otherwise CMP normal, total cholesterol 200, LDL 113 triglycerides 69 HDL 73  ASSESSMENT/PLAN:  diabetes mellitus-stable. CAD-stable. hypertension-well controlled.  hyperlipidemia-well controlled.  Check FLP. dementia-stable.  Namenda was decreased. GERD-well-controlled.  Check cbc & cmp.  CPT CODE: [address]

## 2013-06-24 ENCOUNTER — Non-Acute Institutional Stay (SKILLED_NURSING_FACILITY): Payer: Medicare Other | Admitting: Internal Medicine

## 2013-06-24 DIAGNOSIS — D7289 Other specified disorders of white blood cells: Secondary | ICD-10-CM

## 2013-06-24 DIAGNOSIS — N189 Chronic kidney disease, unspecified: Secondary | ICD-10-CM

## 2013-06-24 DIAGNOSIS — E1159 Type 2 diabetes mellitus with other circulatory complications: Secondary | ICD-10-CM

## 2013-07-01 ENCOUNTER — Encounter: Payer: Self-pay | Admitting: Family

## 2013-07-01 ENCOUNTER — Non-Acute Institutional Stay (SKILLED_NURSING_FACILITY): Payer: Medicare Other | Admitting: Family

## 2013-07-01 DIAGNOSIS — I96 Gangrene, not elsewhere classified: Secondary | ICD-10-CM

## 2013-07-01 NOTE — Progress Notes (Signed)
Patient ID: [name], female   DOB: Jul 01, 1927, 79 y.o.   MRN: [id number] Date: 07/01/13  Facility: [facility]    Chief Complaint  Patient presents with  . Acute Visit    Discolored L great toe    HPI: Pt presents with complaints of black circumscribed ulcer to L great toe.  Pt reports numbness and tingling to BLE.  Pt denies trauma to LLE. Pt and health care team unsure of onset of black ulcer. Pt denies pain/discomfort to LLE and further reports history of DM.       No Known Allergies   Medication List       This list is accurate as of: 07/01/13  9:32 PM.  Always use your most recent med list.               acetaminophen 325 MG tablet  Commonly known as:  TYLENOL  Take 650 mg by mouth every 6 (six) hours as needed for pain.     AMBULATORY NON FORMULARY MEDICATION  - Lorazepam 0.5mg /ml gel  - Sig: Apply 1ml topically every 6 hours as needed for agitation/anxiety     amLODipine 10 MG tablet  Commonly known as:  NORVASC  Take 10 mg by mouth daily.     aspirin EC 325 MG tablet  Take 1 tablet (325 mg total) by mouth daily.     BIOFREEZE EX  Apply 1 application topically 2 (two) times daily as needed (for neck and shoulder pain).     cetirizine 10 MG tablet  Commonly known as:  ZYRTEC  Take 10 mg by mouth daily.     donepezil 10 MG disintegrating tablet  Commonly known as:  ARICEPT ODT  Take 10 mg by mouth at bedtime.     escitalopram 10 MG tablet  Commonly known as:  LEXAPRO  Take 10 mg by mouth daily.     esomeprazole 40 MG packet  Commonly known as:  NEXIUM  Take 40 mg by mouth daily before breakfast.     furosemide 20 MG tablet  Commonly known as:  LASIX  Take 60 mg by mouth daily.     GENTAK 0.3 % ophthalmic ointment  Generic drug:  gentamicin     Ginkgo Biloba 40 MG Tabs  Take 120 mg by mouth at bedtime.     glucosamine-chondroitin 500-400 MG tablet  Take 2 tablets by mouth daily.     hydrocortisone 2.5 % rectal cream  Commonly known  as:  ANUSOL-HC  Place 1 application rectally 2 (two) times daily as needed for hemorrhoids.     insulin aspart 100 UNIT/ML injection  Commonly known as:  novoLOG  Inject 2-14 Units into the skin 4 (four) times daily -  before meals and at bedtime. Sliding Scale 151-200=2units; 201-250=4units; 251-300=6units; 301-350=8units; 351-400=10units; 401-450=12 units; greater than 450=14units     insulin glargine 100 UNIT/ML injection  Commonly known as:  LANTUS  Inject 0.1 mLs (10 Units total) into the skin at bedtime.     ipratropium-albuterol 0.5-2.5 (3) MG/3ML Soln  Commonly known as:  DUONEB  Take 3 mLs by nebulization as needed (shortness or wheezing of breath).     latanoprost 0.005 % ophthalmic solution  Commonly known as:  XALATAN     lisinopril 40 MG tablet  Commonly known as:  PRINIVIL,ZESTRIL  Take 40 mg by mouth daily.     metoprolol succinate 100 MG 24 hr tablet  Commonly known as:  TOPROL-XL  Take 100 mg by  mouth daily. Take with or immediately following a meal.     multivitamin with minerals Tabs tablet  Take 1 tablet by mouth daily.     NAMENDA XR 28 MG Cp24  Generic drug:  Memantine HCl ER  Take 28 mg by mouth daily.     potassium chloride 20 MEQ/15ML (10%) solution  Take 10 mEq by mouth daily.     protein supplement Powd  Take 1 scoop by mouth 3 (three) times daily with meals.     RESTASIS 0.05 % ophthalmic emulsion  Generic drug:  cycloSPORINE     simvastatin 40 MG tablet  Commonly known as:  ZOCOR  Take 40 mg by mouth daily.         DATA REVIEWED  Laboratory Studies: 06/19/13- Hemoglobin A1C 8.0, WBC 11.2, RBC 4.2, Hemoglobin 11.9, Hematocrit 36.8, Platelets 206, BUN 20, Creatinine 1.24  Past Medical History  Diagnosis Date  . CVD (cerebrovascular disease)   . Hypertension   . Diabetes mellitus without complication   . High cholesterol   . Alzheimer's dementia   . Arthritis    History   Social History  . Marital Status: Single    Spouse  Name: N/A    Number of Children: N/A  . Years of Education: N/A   Occupational History  . Not on file.   Social History Main Topics  . Smoking status: Never Smoker   . Smokeless tobacco: Not on file  . Alcohol Use: No  . Drug Use: No  . Sexual Activity: No   Other Topics Concern  . Not on file   Social History Narrative  . No narrative on file     Review of Systems  Constitutional: Negative.   HENT: Negative.   Eyes: Negative.   Respiratory: Negative.   Cardiovascular: Negative.   Genitourinary: Positive for urgency.       Incontinent to urine  Musculoskeletal:       Wheelchair Bound  Skin: Negative.   Neurological: Positive for focal weakness.  Endo/Heme/Allergies:       Diabetes Mellitus  Psychiatric/Behavioral: Negative.      Physical Exam Filed Vitals:   07/01/13 2112  BP: 102/78  Pulse: 64  Temp: 97 F (36.1 C)  Resp: 18   There is no weight on file to calculate BMI. Physical Exam  Constitutional:  Dressed and groomed appropriately; sitting in high-Fowler's position in wheelchair  Cardiovascular: Normal rate, regular rhythm and normal heart sounds.   Pulmonary/Chest: Effort normal and breath sounds normal.  Neurological: She is alert. GCS eye subscore is 4. GCS verbal subscore is 4. GCS motor subscore is 6. She displays Babinski's sign on the right side. She displays Babinski's sign on the left side.  Oriented to self/short-term memory deficits   Skin: Skin is warm and dry.  Circumscribed black ulcer to L great toe (nail bed)    ASSESSMENT/PLAN Necrotic Ulcer L great toe-ABI of BLE    Wound Care Consult from Treatment Nurse    Referral Podiatrist    Diabetic Shoes Ordered    Discussion with family members regarding goals of care and treatment plan    Follow up:Prn

## 2013-07-16 ENCOUNTER — Non-Acute Institutional Stay (SKILLED_NURSING_FACILITY): Payer: Medicare Other | Admitting: Internal Medicine

## 2013-07-16 DIAGNOSIS — E7800 Pure hypercholesterolemia, unspecified: Secondary | ICD-10-CM

## 2013-07-16 DIAGNOSIS — E1159 Type 2 diabetes mellitus with other circulatory complications: Secondary | ICD-10-CM

## 2013-07-16 DIAGNOSIS — I150 Renovascular hypertension: Secondary | ICD-10-CM

## 2013-07-16 DIAGNOSIS — I2510 Atherosclerotic heart disease of native coronary artery without angina pectoris: Secondary | ICD-10-CM

## 2013-07-16 NOTE — Progress Notes (Signed)
PROGRESS NOTE  DATE: 07-16-13  FACILITY: [facility]  LEVEL OF CARE: SNF  Routine Visit  CHIEF COMPLAINT:  Manage HTN, diabetes mellitus and coronary artery disease  HISTORY OF PRESENT ILLNESS:  REASSESSMENT OF ONGOING PROBLEM(S):  DM:pt's DM is unstable.  Staff deny polyuria, polydipsia, polyphagia, changes in vision or hypoglycemic episodes.  No complications noted from the medication presently being used.  Last hemoglobin A1c is: 7 in 4/14, 8.1 in 2/14, in 7-14 hemoglobin A1c 8.1, in 10/14 hemoglobin A1c 8. Patient is a poor historian.  CAD: The angina has been stable. The staff deny dyspnea on exertion, orthopnea, pedal edema, palpitations and paroxysmal nocturnal dyspnea. No complications noted from the medication presently being used.  HTN: Pt 's HTN is unstable.  Denies CP, sob, DOE, pedal edema, headaches, dizziness or visual disturbances.  No complications from the medications currently being used.  Last BP : 150/96, 144/80, 156/70, 110/68, 116/67.  PAST MEDICAL HISTORY : Reviewed.  No changes.  CURRENT MEDICATIONS: Reviewed per [facility]  REVIEW OF SYSTEMS: Unobtainable due to dementia.  PHYSICAL EXAMINATION  VS:  T 96.5    P 52      RR 18     BP 116/67   POX %     WT (Lb) 172  GENERAL: no acute distress, moderately obese body habitus NECK: supple, trachea midline, no neck masses, no thyroid tenderness, no thyromegaly RESPIRATORY: breathing is even & unlabored, BS CTAB CARDIAC: RRR, no murmur,no extra heart sounds, +1 bilateral lower extremity edema GI: abdomen soft, normal BS, no masses, no tenderness, no hepatomegaly, no splenomegaly PSYCHIATRIC: the patient is alert & disoriented, agitated  LABS/RADIOLOGY:  10-14 WBC 11.2 otherwise CBC normal, creatinine 1.24 otherwise CMP normal, fasting lipid panel normal  5/14 CBC normal, glucose 132, creatinine 1.29 otherwise BMP normal 4/14 glucose 112, creatinine 1.3 otherwise BMP normal, albumin 3.4 otherwise liver  profile normal, fasting lipid panel normal 3/14 hemoglobin 11.1, hemoglobin 10.6 MCV 89 otherwise CBC normal 11/13 creatinine 1.02 otherwise BMP normal 10/13 alkaline phosphatase 111 otherwise CMP normal, total cholesterol 200, LDL 113 triglycerides 69 HDL 73  ASSESSMENT/PLAN:  diabetes mellitus-uncontrolled. Lantus was increased. CAD-stable. hypertension-well controlled.  hyperlipidemia-well controlled.  dementia-patient agitated today GERD-well-controlled.   CPT CODE: 16109

## 2013-07-18 DIAGNOSIS — D7289 Other specified disorders of white blood cells: Secondary | ICD-10-CM | POA: Insufficient documentation

## 2013-07-18 NOTE — Progress Notes (Signed)
Patient ID: [name], female   DOB: 1926-12-15, 79 y.o.   MRN: [id number]        PROGRESS NOTE  DATE: 06/24/2013  FACILITY:  [facility]  LEVEL OF CARE: SNF (31)  Acute Visit  CHIEF COMPLAINT:  Manage diabetes mellitus and chronic kidney disease.    HISTORY OF PRESENT ILLNESS: I was requested by the staff to assess the patient regarding above problem(s):  DM:pt's DM is unstable.  Staff denies polyuria, polydipsia, polyphagia, changes in vision or hypoglycemic episodes.  No complications noted from the medication presently being used.  Last hemoglobin A1c is:  8 on 06/20/2013.    CHRONIC KIDNEY DISEASE: The patient's chronic kidney disease remains stable.  Staff denies increasing lower extremity swelling or confusion. Last BUN and creatinine are:   On 06/19/2013:  BUN 20, creatinine 1.24.  In 01/2013:  Creatinine 1.29.     PAST MEDICAL HISTORY : Reviewed.  No changes.  CURRENT MEDICATIONS: Reviewed per [facility]  REVIEW OF SYSTEMS:  Unobtainable due to dementia.    PHYSICAL EXAMINATION  GENERAL: no acute distress, moderately obese body habitus EYES: conjunctivae normal, sclerae normal, normal eye lids NECK: supple, trachea midline, no neck masses, no thyroid tenderness, no thyromegaly LYMPHATICS: no LAN in the neck, no supraclavicular LAN RESPIRATORY: breathing is even & unlabored, BS CTAB CARDIAC: RRR, no murmur,no extra heart sounds   EDEMA/VARICOSITIES:  +2 edema  ARTERIAL:  pedal pulses nonpalpable   GI: abdomen soft, normal BS, no masses, no tenderness, no hepatomegaly, no splenomegaly PSYCHIATRIC: the patient is minimally alert, decreased affect and mood    LABS/RADIOLOGY: On 06/19/2013:  WBC 11.2.    In 01/2013:  WBC 8.2.     ASSESSMENT/PLAN:  Diabetes mellitus with vascular complications.  Uncontrolled problem.  Increase Lantus to 15 U q.h.s.    Chronic kidney disease.  Renal functions improved.    Leukocytosis.  New problem.  Patient is  asymptomatic.  We will monitor.    CPT CODE: 14782

## 2013-08-20 ENCOUNTER — Non-Acute Institutional Stay (SKILLED_NURSING_FACILITY): Payer: Medicare Other | Admitting: Internal Medicine

## 2013-08-20 ENCOUNTER — Encounter: Payer: Self-pay | Admitting: Internal Medicine

## 2013-08-20 DIAGNOSIS — I10 Essential (primary) hypertension: Secondary | ICD-10-CM

## 2013-08-20 DIAGNOSIS — E1059 Type 1 diabetes mellitus with other circulatory complications: Secondary | ICD-10-CM

## 2013-08-20 DIAGNOSIS — I2510 Atherosclerotic heart disease of native coronary artery without angina pectoris: Secondary | ICD-10-CM

## 2013-08-20 DIAGNOSIS — E7800 Pure hypercholesterolemia, unspecified: Secondary | ICD-10-CM

## 2013-08-20 NOTE — Progress Notes (Signed)
PROGRESS NOTE  DATE: 08-20-13  FACILITY: [facility]  LEVEL OF CARE: SNF  Routine Visit  CHIEF COMPLAINT:  Manage HTN, diabetes mellitus and coronary artery disease  HISTORY OF PRESENT ILLNESS:  REASSESSMENT OF ONGOING PROBLEM(S):  DM:pt's DM is unstable.  Staff deny polyuria, polydipsia, polyphagia, changes in vision or hypoglycemic episodes.  No complications noted from the medication presently being used.  Last hemoglobin A1c is: 7 in 4/14, 8.1 in 2/14, in 7-14 hemoglobin A1c 8.1, in 10/14 hemoglobin A1c 8. Patient is a poor historian.  CAD: The angina has been stable. The staff deny dyspnea on exertion, orthopnea, pedal edema, palpitations and paroxysmal nocturnal dyspnea. No complications noted from the medication presently being used.  HTN: Pt 's HTN is unstable.  Denies CP, sob, DOE, pedal edema, headaches, dizziness or visual disturbances.  No complications from the medications currently being used.  Last BP : 150/96, 144/80, 156/70, 110/68, 116/67, 116/60.  PAST MEDICAL HISTORY : Reviewed.  No changes.  CURRENT MEDICATIONS: Reviewed per [facility]  REVIEW OF SYSTEMS: Unobtainable due to dementia.  PHYSICAL EXAMINATION  VS:  T 98.3   P 51      RR 18     BP 116/60   POX %     WT (Lb) 174  GENERAL: no acute distress, moderately obese body habitus NECK: supple, trachea midline, no neck masses, no thyroid tenderness, no thyromegaly RESPIRATORY: breathing is even & unlabored, BS CTAB CARDIAC: RRR, no murmur,no extra heart sounds, +1 bilateral lower extremity edema GI: abdomen soft, normal BS, no masses, no tenderness, no hepatomegaly, no splenomegaly PSYCHIATRIC: the patient is alert & disoriented, agitated  LABS/RADIOLOGY:  10-14 WBC 11.2 otherwise CBC normal, creatinine 1.24 otherwise CMP normal, fasting lipid panel normal  5/14 CBC normal, glucose 132, creatinine 1.29 otherwise BMP normal 4/14 glucose 112, creatinine 1.3 otherwise BMP normal, albumin 3.4 otherwise  liver profile normal, fasting lipid panel normal 3/14 hemoglobin 11.1, hemoglobin 10.6 MCV 89 otherwise CBC normal 11/13 creatinine 1.02 otherwise BMP normal 10/13 alkaline phosphatase 111 otherwise CMP normal, total cholesterol 200, LDL 113 triglycerides 69 HDL 73  ASSESSMENT/PLAN:  diabetes mellitus-uncontrolled. Lantus was increased. CAD-stable. hypertension-well controlled.  hyperlipidemia-well controlled.  dementia-patient agitated today GERD-well-controlled.   CPT CODE: 78295

## 2013-09-17 ENCOUNTER — Non-Acute Institutional Stay (SKILLED_NURSING_FACILITY): Payer: Medicare Other | Admitting: Internal Medicine

## 2013-09-17 DIAGNOSIS — I10 Essential (primary) hypertension: Secondary | ICD-10-CM

## 2013-09-17 DIAGNOSIS — I2510 Atherosclerotic heart disease of native coronary artery without angina pectoris: Secondary | ICD-10-CM

## 2013-09-17 DIAGNOSIS — E7800 Pure hypercholesterolemia, unspecified: Secondary | ICD-10-CM

## 2013-09-17 DIAGNOSIS — E1159 Type 2 diabetes mellitus with other circulatory complications: Secondary | ICD-10-CM

## 2013-09-17 NOTE — Progress Notes (Signed)
PROGRESS NOTE  DATE: 09-17-13  FACILITY: [facility]  LEVEL OF CARE: SNF  Routine Visit  CHIEF COMPLAINT:  Manage HTN, diabetes mellitus and coronary artery disease  HISTORY OF PRESENT ILLNESS:  REASSESSMENT OF ONGOING PROBLEM(S):  DM:pt's DM is unstable.  Staff deny polyuria, polydipsia, polyphagia, changes in vision or hypoglycemic episodes.  No complications noted from the medication presently being used.  Last hemoglobin A1c is: 7 in 4/14, 8.1 in 2/14, in 7-14 hemoglobin A1c 8.1, in 10/14 hemoglobin A1c 8. Patient is a poor historian.  CAD: The angina has been stable. The staff deny dyspnea on exertion, orthopnea, palpitations and paroxysmal nocturnal dyspnea. No complications noted from the medication presently being used.  HTN: Pt 's HTN is unstable.  Staff Denies CP, sob, DOE, headaches, dizziness or visual disturbances.  No complications from the medications currently being used.  Last BP : 150/96, 144/80, 156/70, 110/68, 116/67, 116/60, 140/84.  PAST MEDICAL HISTORY : Reviewed.  No changes.  CURRENT MEDICATIONS: Reviewed per [facility]  REVIEW OF SYSTEMS: Unobtainable due to dementia.  PHYSICAL EXAMINATION  VS:  T 98.8   P 74      RR 18     BP 140/84   POX %     WT (Lb) 175  GENERAL: no acute distress, moderately obese body habitus NECK: supple, trachea midline, no neck masses, no thyroid tenderness, no thyromegaly RESPIRATORY: breathing is even & unlabored, BS CTAB CARDIAC: RRR, no murmur,no extra heart sounds, +2 bilateral lower extremity edema GI: abdomen soft, normal BS, no masses, no tenderness, no hepatomegaly, no splenomegaly PSYCHIATRIC: the patient is alert & disoriented, agitated  LABS/RADIOLOGY:  10-14 WBC 11.2 otherwise CBC normal, creatinine 1.24 otherwise CMP normal, fasting lipid panel normal  5/14 CBC normal, glucose 132, creatinine 1.29 otherwise BMP normal 4/14 glucose 112, creatinine 1.3 otherwise BMP normal, albumin 3.4 otherwise liver profile  normal, fasting lipid panel normal 3/14 hemoglobin 11.1, hemoglobin 10.6 MCV 89 otherwise CBC normal 11/13 creatinine 1.02 otherwise BMP normal 10/13 alkaline phosphatase 111 otherwise CMP normal, total cholesterol 200, LDL 113 triglycerides 69 HDL 73  ASSESSMENT/PLAN:  diabetes mellitus-uncontrolled. Lantus was increased. CAD-stable. hypertension-blood pressure borderline  hyperlipidemia-well controlled.  dementia-Depakote was started GERD-well-controlled.  Check Depakote level  CPT CODE: 66063

## 2013-09-24 ENCOUNTER — Non-Acute Institutional Stay (SKILLED_NURSING_FACILITY): Payer: Medicare Other | Admitting: Internal Medicine

## 2013-09-24 DIAGNOSIS — E1159 Type 2 diabetes mellitus with other circulatory complications: Secondary | ICD-10-CM

## 2013-09-24 NOTE — Progress Notes (Signed)
         PROGRESS NOTE  DATE: 09/24/2013  FACILITY:  [facility]  LEVEL OF CARE: SNF (31)  Acute Visit  CHIEF COMPLAINT:  Manage diabetes mellitus  HISTORY OF PRESENT ILLNESS: I was requested by the staff to assess the patient regarding above problem(s):  DM:pt's DM is unstable.  Staff deny polyuria, polydipsia, polyphagia, changes in vision or hypoglycemic episodes.  No complications noted from the medication presently being used.  Last hemoglobin A1c is: 7.3 on 09-18-13.  PAST MEDICAL HISTORY : Reviewed.  No changes.  CURRENT MEDICATIONS: Reviewed per [facility]  REVIEW OF SYSTEMS: Unobtainable due to dementia  PHYSICAL EXAMINATION  GENERAL: no acute distress, moderately obese body habitus RESPIRATORY: breathing is even & unlabored, BS CTAB CARDIAC: RRR, no murmur,no extra heart sounds, no edema GI: abdomen soft, normal BS, no masses, no tenderness, no hepatomegaly, no splenomegaly PSYCHIATRIC: the patient is alert & disoriented, affect & behavior appropriate  LABS/RADIOLOGY: See history of present illness  ASSESSMENT/PLAN:  Diabetes mellitus-uncontrolled problem. Increase Lantus to 15 units each bedtime.  CPT CODE: 88416

## 2013-09-30 NOTE — Telephone Encounter (Signed)
Encounter Complete.  

## 2013-10-01 ENCOUNTER — Non-Acute Institutional Stay (SKILLED_NURSING_FACILITY): Payer: Medicare Other | Admitting: Internal Medicine

## 2013-10-01 DIAGNOSIS — N189 Chronic kidney disease, unspecified: Secondary | ICD-10-CM

## 2013-10-01 NOTE — Progress Notes (Signed)
         PROGRESS NOTE  DATE: 10/01/2013  FACILITY:  [facility]  LEVEL OF CARE: SNF (31)  Acute Visit  CHIEF COMPLAINT:  Manage CKD  HISTORY OF PRESENT ILLNESS: I was requested by the staff to assess the patient regarding above problem(s):  CHRONIC KIDNEY DISEASE: The patient's chronic kidney disease remains stable.  Staff deny increasing lower extremity swelling or confusion. Last BUN and creatinine are: 18/1.05. In 10-14 creatinine 1.4. Patient is a poor historian.  PAST MEDICAL HISTORY : Reviewed.  No changes.  CURRENT MEDICATIONS: Reviewed per [facility]  PHYSICAL EXAMINATION  GENERAL: no acute distress, normal body habitus RESPIRATORY: breathing is even & unlabored, BS CTAB CARDIAC: RRR, no murmur,no extra heart sounds, +1 bilateral lower extremity edema  LABS/RADIOLOGY: See history of present illness  ASSESSMENT/PLAN:  Chronic kidney disease-renal functions improved.  CPT CODE: 79390

## 2013-10-15 ENCOUNTER — Non-Acute Institutional Stay (SKILLED_NURSING_FACILITY): Payer: Medicare Other | Admitting: Internal Medicine

## 2013-10-15 DIAGNOSIS — E1159 Type 2 diabetes mellitus with other circulatory complications: Secondary | ICD-10-CM

## 2013-10-15 DIAGNOSIS — I150 Renovascular hypertension: Secondary | ICD-10-CM

## 2013-10-15 DIAGNOSIS — N189 Chronic kidney disease, unspecified: Secondary | ICD-10-CM

## 2013-10-15 DIAGNOSIS — I2510 Atherosclerotic heart disease of native coronary artery without angina pectoris: Secondary | ICD-10-CM

## 2013-10-20 NOTE — Progress Notes (Signed)
         PROGRESS NOTE  DATE: 10-15-13  FACILITY: [facility]  LEVEL OF CARE: SNF  Routine Visit  CHIEF COMPLAINT:  Manage chronic kidney disease, HTN, diabetes mellitus and coronary artery disease  HISTORY OF PRESENT ILLNESS:  REASSESSMENT OF ONGOING PROBLEM(S):  CHRONIC KIDNEY DISEASE: The patient's chronic kidney disease remains stable.  Patient denies increasing lower extremity swelling or confusion. Last BUN and creatinine are: 18/1.45 in 1-20. In 10- 14 creatinine 1.24.  DM:pt's DM is unstable.  Staff deny polyuria, polydipsia, polyphagia, changes in vision or hypoglycemic episodes.  No complications noted from the medication presently being used.  Last hemoglobin A1c is: 7 in 4/14, 8.1 in 2/14, in 7-14 hemoglobin A1c 8.1, in 10/14 hemoglobin A1c 8. In 1-15 hemoglobin A1c 7.3 Patient is a poor historian.  CAD: The angina has been stable. The staff deny dyspnea on exertion, orthopnea, palpitations and paroxysmal nocturnal dyspnea. No complications noted from the medication presently being used.  HTN: Pt 's HTN is unstable.  Staff Denies CP, sob, DOE, headaches, dizziness or visual disturbances.  No complications from the medications currently being used.  Last BP : 150/96, 144/80, 156/70, 110/68, 116/67, 116/60, 140/84, 111/56.  PAST MEDICAL HISTORY : Reviewed.  No changes.  CURRENT MEDICATIONS: Reviewed per [facility]  REVIEW OF SYSTEMS: Unobtainable due to dementia.  PHYSICAL EXAMINATION  VS:  T 96.4   P 49      RR 20     BP 111/56    WT (Lb) 170  GENERAL: no acute distress, moderately obese body habitus EYES: Normal sclerae, normal conjunctivae, no discharge NECK: supple, trachea midline, no neck masses, no thyroid tenderness, no thyromegaly LYMPHATICS: No cervical lymphadenopathy, no supraclavicular lymphadenopathy RESPIRATORY: breathing is even & unlabored, BS CTAB CARDIAC: RRR, no murmur,no extra heart sounds, +2 bilateral lower extremity edema GI: abdomen soft, normal BS,  no masses, no tenderness, no hepatomegaly, no splenomegaly PSYCHIATRIC: the patient is alert & disoriented, agitated  LABS/RADIOLOGY: 1-15 glucose 134, creatinine 1.05 otherwise BMP normal, CBC normal, Depakote level 28 10-14 WBC 11.2 otherwise CBC normal, creatinine 1.24 otherwise CMP normal, fasting lipid panel normal  5/14 CBC normal, glucose 132, creatinine 1.29 otherwise BMP normal 4/14 glucose 112, creatinine 1.3 otherwise BMP normal, albumin 3.4 otherwise liver profile normal, fasting lipid panel normal 3/14 hemoglobin 11.1, hemoglobin 10.6 MCV 89 otherwise CBC normal 11/13 creatinine 1.02 otherwise BMP normal 10/13 alkaline phosphatase 111 otherwise CMP normal, total cholesterol 200, LDL 113 triglycerides 69 HDL 73  ASSESSMENT/PLAN:  Chronic kidney disease-renal functions improved diabetes mellitus-uncontrolled. Lantus was increased. CAD-stable. hypertension-blood pressure borderline  hyperlipidemia-well controlled.  dementia-advanced GERD-well-controlled  CPT CODE: 74128

## 2013-11-18 ENCOUNTER — Other Ambulatory Visit: Payer: Self-pay | Admitting: *Deleted

## 2013-11-18 MED ORDER — AMBULATORY NON FORMULARY MEDICATION: Status: DC

## 2013-11-18 NOTE — Telephone Encounter (Signed)
[facility] 

## 2014-01-13 ENCOUNTER — Non-Acute Institutional Stay (SKILLED_NURSING_FACILITY): Payer: Medicare Other | Admitting: Internal Medicine

## 2014-01-13 DIAGNOSIS — I2510 Atherosclerotic heart disease of native coronary artery without angina pectoris: Secondary | ICD-10-CM

## 2014-01-13 DIAGNOSIS — I150 Renovascular hypertension: Secondary | ICD-10-CM

## 2014-01-13 DIAGNOSIS — N189 Chronic kidney disease, unspecified: Secondary | ICD-10-CM

## 2014-01-13 DIAGNOSIS — E1159 Type 2 diabetes mellitus with other circulatory complications: Secondary | ICD-10-CM

## 2014-01-13 NOTE — Progress Notes (Signed)
          PROGRESS NOTE  DATE: 01-13-14  FACILITY: [facility]  LEVEL OF CARE: SNF  Routine Visit  CHIEF COMPLAINT:  Manage chronic kidney disease, HTN, diabetes mellitus and coronary artery disease  HISTORY OF PRESENT ILLNESS:  REASSESSMENT OF ONGOING PROBLEM(S):  CHRONIC KIDNEY DISEASE: The patient's chronic kidney disease remains stable.  Patient denies increasing lower extremity swelling or confusion. Last BUN and creatinine are: 18/1.45 in 1-20. In 10- 14 creatinine 1.24, in 4-15 BUN 15, creatinine 1.08.  DM:pt's DM is unstable.  Staff deny polyuria, polydipsia, polyphagia, changes in vision or hypoglycemic episodes.  No complications noted from the medication presently being used.  Last hemoglobin A1c is: 7 in 4/14, 8.1 in 2/14, in 7-14 hemoglobin A1c 8.1, in 10/14 hemoglobin A1c 8. In 1-15 hemoglobin A1c 7.3 Patient is a poor historian.  CAD: The angina has been stable. The staff deny dyspnea on exertion, orthopnea, palpitations and paroxysmal nocturnal dyspnea. No complications noted from the medication presently being used.  HTN: Pt 's HTN is unstable.  Staff Denies CP, sob, DOE, headaches, dizziness or visual disturbances.  No complications from the medications currently being used.  Last BP : 150/96, 144/80, 156/70, 110/68, 116/67, 116/60, 140/84, 111/56, 133/74.  PAST MEDICAL HISTORY : Reviewed.  No changes.  CURRENT MEDICATIONS: Reviewed per [facility]  REVIEW OF SYSTEMS: Unobtainable due to dementia.  PHYSICAL EXAMINATION  GENERAL: no acute distress, moderately obese body habitus EYES: Normal sclerae, normal conjunctivae, no discharge NECK: supple, trachea midline, no neck masses, no thyroid tenderness, no thyromegaly LYMPHATICS: No cervical lymphadenopathy, no supraclavicular lymphadenopathy RESPIRATORY: breathing is even & unlabored, BS CTAB CARDIAC: RRR, no murmur,no extra heart sounds, +1 bilateral lower extremity edema GI: abdomen soft, normal BS, no masses, no  tenderness, no hepatomegaly, no splenomegaly PSYCHIATRIC: the patient is alert & disoriented, agitated  LABS/RADIOLOGY: 4-15 CBC normal, glucose 112, creatinine 1.8, total protein 5.8, albumin 3.4 otherwise CMP normal 1-15 glucose 134, creatinine 1.05 otherwise BMP normal, CBC normal, Depakote level 28 10-14 WBC 11.2 otherwise CBC normal, creatinine 1.24 otherwise CMP normal, fasting lipid panel normal  5/14 CBC normal, glucose 132, creatinine 1.29 otherwise BMP normal 4/14 glucose 112, creatinine 1.3 otherwise BMP normal, albumin 3.4 otherwise liver profile normal, fasting lipid panel normal 3/14 hemoglobin 11.1, hemoglobin 10.6 MCV 89 otherwise CBC normal 11/13 creatinine 1.02 otherwise BMP normal 10/13 alkaline phosphatase 111 otherwise CMP normal, total cholesterol 200, LDL 113 triglycerides 69 HDL 73  ASSESSMENT/PLAN:  Chronic kidney disease-renal functions improved diabetes mellitus-check hemoglobin A1c CAD-stable. hypertension-well controlled hyperlipidemia-check fasting lipid panel dementia-advanced GERD-well-controlled  CPT CODE: [id number]  Edgar Frisk. Durwin Reges, [place] [phone]

## 2014-02-10 ENCOUNTER — Non-Acute Institutional Stay (SKILLED_NURSING_FACILITY): Payer: Medicare Other | Admitting: Internal Medicine

## 2014-02-10 DIAGNOSIS — E1059 Type 1 diabetes mellitus with other circulatory complications: Secondary | ICD-10-CM

## 2014-02-10 DIAGNOSIS — N189 Chronic kidney disease, unspecified: Secondary | ICD-10-CM

## 2014-02-10 DIAGNOSIS — I150 Renovascular hypertension: Secondary | ICD-10-CM

## 2014-02-10 DIAGNOSIS — I2510 Atherosclerotic heart disease of native coronary artery without angina pectoris: Secondary | ICD-10-CM

## 2014-02-10 NOTE — Progress Notes (Signed)
         PROGRESS NOTE  DATE: 02-10-14  FACILITY: [facility]  LEVEL OF CARE: SNF  Routine Visit  CHIEF COMPLAINT:  Manage chronic kidney disease, HTN, diabetes mellitus and coronary artery disease  HISTORY OF PRESENT ILLNESS:  REASSESSMENT OF ONGOING PROBLEM(S):  CHRONIC KIDNEY DISEASE: The patient's chronic kidney disease remains stable.  Patient denies increasing lower extremity swelling or confusion. Last BUN and creatinine are: 18/1.45 in 1-20. In 10- 14 creatinine 1.24, in 4-15 BUN 15, creatinine 1.08.  DM:pt's DM is unstable.  Staff deny polyuria, polydipsia, polyphagia, changes in vision or hypoglycemic episodes.  No complications noted from the medication presently being used.  Last hemoglobin A1c is: 7 in 4/14, 8.1 in 2/14, in 7-14 hemoglobin A1c 8.1, in 10/14 hemoglobin A1c 8. In 1-15 hemoglobin A1c 7.3, in 4-15 hemoglobin A1c 6.7. Patient is a poor historian.  CAD: The angina has been stable. The staff deny dyspnea on exertion, orthopnea, palpitations and paroxysmal nocturnal dyspnea. No complications noted from the medication presently being used.  HTN: Pt 's HTN is unstable.  Staff Denies CP, sob, DOE, headaches, dizziness or visual disturbances.  No complications from the medications currently being used.  Last BP : 150/96, 144/80, 156/70, 110/68, 116/67, 116/60, 140/84, 111/56, 133/74, 165/93.  PAST MEDICAL HISTORY : Reviewed.  No changes.  CURRENT MEDICATIONS: Reviewed per [facility]  REVIEW OF SYSTEMS: Unobtainable due to dementia.  PHYSICAL EXAMINATION  GENERAL: no acute distress, moderately obese body habitus NECK: supple, trachea midline, no neck masses, no thyroid tenderness, no thyromega RESPIRATORY: breathing is even & unlabored, BS CTAB CARDIAC: RRR, no murmur,no extra heart sounds, +1 bilateral lower extremity edema GI: abdomen soft, normal BS, no masses, no tenderness, no hepatomegaly, no splenomegaly PSYCHIATRIC: the patient is alert & disoriented,  agitated  LABS/RADIOLOGY: 4-15 CBC normal, glucose 112, creatinine 1.08, total protein 5.8, albumin 3.4 otherwise CMP normal, fasting lipid panel normal 1-15 glucose 134, creatinine 1.05 otherwise BMP normal, CBC normal, Depakote level 28 10-14 WBC 11.2 otherwise CBC normal, creatinine 1.24 otherwise CMP normal, fasting lipid panel normal  5/14 CBC normal, glucose 132, creatinine 1.29 otherwise BMP normal 4/14 glucose 112, creatinine 1.3 otherwise BMP normal, albumin 3.4 otherwise liver profile normal, fasting lipid panel normal 3/14 hemoglobin 11.1, hemoglobin 10.6 MCV 89 otherwise CBC normal 11/13 creatinine 1.02 otherwise BMP normal 10/13 alkaline phosphatase 111 otherwise CMP normal, total cholesterol 200, LDL 113 triglycerides 69 HDL 73  ASSESSMENT/PLAN:  Chronic kidney disease-renal functions improved diabetes mellitus-well controlled CAD-stable. Renovascular hypertension-blood pressure is elevated. Will review a log. Hyperlipidemia-well controlled dementia-advanced GERD-well-controlled Insomnia-trazodone was started  CPT CODE: 99357  Edgar Frisk. Durwin Reges, [place] [phone]

## 2014-02-26 ENCOUNTER — Non-Acute Institutional Stay (SKILLED_NURSING_FACILITY): Payer: Medicare Other | Admitting: Internal Medicine

## 2014-02-26 DIAGNOSIS — R4182 Altered mental status, unspecified: Secondary | ICD-10-CM

## 2014-02-28 ENCOUNTER — Emergency Department (HOSPITAL_COMMUNITY): Payer: Medicare Other

## 2014-02-28 ENCOUNTER — Encounter (HOSPITAL_COMMUNITY): Payer: Self-pay | Admitting: Emergency Medicine

## 2014-02-28 ENCOUNTER — Inpatient Hospital Stay (HOSPITAL_COMMUNITY)
Admission: EM | Admit: 2014-02-28 | Discharge: 2014-03-06 | DRG: 683 | Disposition: A | Discharge status: Skilled Nursing Facility | Payer: Medicare Other | Attending: Internal Medicine | Admitting: Internal Medicine

## 2014-02-28 DIAGNOSIS — E7800 Pure hypercholesterolemia, unspecified: Secondary | ICD-10-CM | POA: Diagnosis present

## 2014-02-28 DIAGNOSIS — I639 Cerebral infarction, unspecified: Secondary | ICD-10-CM

## 2014-02-28 DIAGNOSIS — D45 Polycythemia vera: Secondary | ICD-10-CM | POA: Diagnosis present

## 2014-02-28 DIAGNOSIS — F0280 Dementia in other diseases classified elsewhere without behavioral disturbance: Secondary | ICD-10-CM | POA: Diagnosis present

## 2014-02-28 DIAGNOSIS — E1165 Type 2 diabetes mellitus with hyperglycemia: Secondary | ICD-10-CM | POA: Diagnosis present

## 2014-02-28 DIAGNOSIS — N390 Urinary tract infection, site not specified: Secondary | ICD-10-CM | POA: Diagnosis present

## 2014-02-28 DIAGNOSIS — IMO0002 Reserved for concepts with insufficient information to code with codable children: Secondary | ICD-10-CM | POA: Diagnosis present

## 2014-02-28 DIAGNOSIS — R5381 Other malaise: Secondary | ICD-10-CM | POA: Diagnosis present

## 2014-02-28 DIAGNOSIS — N189 Chronic kidney disease, unspecified: Secondary | ICD-10-CM | POA: Diagnosis present

## 2014-02-28 DIAGNOSIS — E785 Hyperlipidemia, unspecified: Secondary | ICD-10-CM | POA: Diagnosis present

## 2014-02-28 DIAGNOSIS — E43 Unspecified severe protein-calorie malnutrition: Secondary | ICD-10-CM | POA: Insufficient documentation

## 2014-02-28 DIAGNOSIS — H109 Unspecified conjunctivitis: Secondary | ICD-10-CM | POA: Diagnosis present

## 2014-02-28 DIAGNOSIS — A498 Other bacterial infections of unspecified site: Secondary | ICD-10-CM | POA: Diagnosis present

## 2014-02-28 DIAGNOSIS — E1169 Type 2 diabetes mellitus with other specified complication: Secondary | ICD-10-CM

## 2014-02-28 DIAGNOSIS — N19 Unspecified kidney failure: Secondary | ICD-10-CM

## 2014-02-28 DIAGNOSIS — I150 Renovascular hypertension: Secondary | ICD-10-CM

## 2014-02-28 DIAGNOSIS — D696 Thrombocytopenia, unspecified: Secondary | ICD-10-CM | POA: Diagnosis present

## 2014-02-28 DIAGNOSIS — R778 Other specified abnormalities of plasma proteins: Secondary | ICD-10-CM

## 2014-02-28 DIAGNOSIS — N183 Chronic kidney disease, stage 3 unspecified: Secondary | ICD-10-CM

## 2014-02-28 DIAGNOSIS — R1310 Dysphagia, unspecified: Secondary | ICD-10-CM

## 2014-02-28 DIAGNOSIS — Z79899 Other long term (current) drug therapy: Secondary | ICD-10-CM

## 2014-02-28 DIAGNOSIS — H6690 Otitis media, unspecified, unspecified ear: Secondary | ICD-10-CM | POA: Diagnosis present

## 2014-02-28 DIAGNOSIS — N179 Acute kidney failure, unspecified: Principal | ICD-10-CM | POA: Diagnosis present

## 2014-02-28 DIAGNOSIS — I129 Hypertensive chronic kidney disease with stage 1 through stage 4 chronic kidney disease, or unspecified chronic kidney disease: Secondary | ICD-10-CM | POA: Diagnosis present

## 2014-02-28 DIAGNOSIS — E870 Hyperosmolality and hypernatremia: Secondary | ICD-10-CM | POA: Diagnosis present

## 2014-02-28 DIAGNOSIS — R7989 Other specified abnormal findings of blood chemistry: Secondary | ICD-10-CM

## 2014-02-28 DIAGNOSIS — Z794 Long term (current) use of insulin: Secondary | ICD-10-CM

## 2014-02-28 DIAGNOSIS — Z8673 Personal history of transient ischemic attack (TIA), and cerebral infarction without residual deficits: Secondary | ICD-10-CM

## 2014-02-28 DIAGNOSIS — R5383 Other fatigue: Secondary | ICD-10-CM

## 2014-02-28 DIAGNOSIS — G309 Alzheimer's disease, unspecified: Secondary | ICD-10-CM | POA: Diagnosis present

## 2014-02-28 DIAGNOSIS — E1059 Type 1 diabetes mellitus with other circulatory complications: Secondary | ICD-10-CM

## 2014-02-28 DIAGNOSIS — R4182 Altered mental status, unspecified: Secondary | ICD-10-CM | POA: Diagnosis present

## 2014-02-28 DIAGNOSIS — I10 Essential (primary) hypertension: Secondary | ICD-10-CM

## 2014-02-28 DIAGNOSIS — R400 Somnolence: Secondary | ICD-10-CM

## 2014-02-28 DIAGNOSIS — I4891 Unspecified atrial fibrillation: Secondary | ICD-10-CM | POA: Diagnosis present

## 2014-02-28 DIAGNOSIS — I959 Hypotension, unspecified: Secondary | ICD-10-CM | POA: Diagnosis present

## 2014-02-28 DIAGNOSIS — M1990 Unspecified osteoarthritis, unspecified site: Secondary | ICD-10-CM

## 2014-02-28 DIAGNOSIS — D7289 Other specified disorders of white blood cells: Secondary | ICD-10-CM

## 2014-02-28 DIAGNOSIS — E1159 Type 2 diabetes mellitus with other circulatory complications: Secondary | ICD-10-CM | POA: Diagnosis present

## 2014-02-28 DIAGNOSIS — M129 Arthropathy, unspecified: Secondary | ICD-10-CM | POA: Diagnosis present

## 2014-02-28 DIAGNOSIS — I798 Other disorders of arteries, arterioles and capillaries in diseases classified elsewhere: Secondary | ICD-10-CM | POA: Diagnosis present

## 2014-02-28 HISTORY — DX: Migraine, unspecified, not intractable, without status migrainosus: G43.909

## 2014-02-28 HISTORY — DX: Reserved for inherently not codable concepts without codable children: IMO0001

## 2014-02-28 HISTORY — DX: Depression, unspecified: F32.A

## 2014-02-28 HISTORY — DX: Heart failure, unspecified: I50.9

## 2014-02-28 HISTORY — DX: Major depressive disorder, single episode, unspecified: F32.9

## 2014-02-28 HISTORY — DX: Cerebral infarction, unspecified: I63.9

## 2014-02-28 HISTORY — DX: Headache: R51

## 2014-02-28 HISTORY — DX: Gastro-esophageal reflux disease without esophagitis: K21.9

## 2014-02-28 HISTORY — DX: Anemia, unspecified: D64.9

## 2014-02-28 HISTORY — DX: Gangrene, not elsewhere classified: I96

## 2014-02-28 HISTORY — DX: Transient cerebral ischemic attack, unspecified: G45.9

## 2014-02-28 HISTORY — DX: Procedure and treatment not carried out because of patient's decision for reasons of belief and group pressure: Z53.1

## 2014-02-28 HISTORY — DX: Type 2 diabetes mellitus without complications: E11.9

## 2014-02-28 LAB — URINALYSIS, ROUTINE W REFLEX MICROSCOPIC
Glucose, UA: NEGATIVE mg/dL
HGB URINE DIPSTICK: NEGATIVE
Ketones, ur: 15 mg/dL — AB
Nitrite: NEGATIVE
PROTEIN: NEGATIVE mg/dL
Specific Gravity, Urine: 1.019 (ref 1.005–1.030)
UROBILINOGEN UA: 1 mg/dL (ref 0.0–1.0)
pH: 5.5 (ref 5.0–8.0)

## 2014-02-28 LAB — CBC WITH DIFFERENTIAL/PLATELET
BASOS ABS: 0.1 10*3/uL (ref 0.0–0.1)
Basophils Relative: 1 % (ref 0–1)
EOS ABS: 0.1 10*3/uL (ref 0.0–0.7)
Eosinophils Relative: 1 % (ref 0–5)
HCT: 49.5 % — ABNORMAL HIGH (ref 36.0–46.0)
Hemoglobin: 16 g/dL — ABNORMAL HIGH (ref 12.0–15.0)
LYMPHS ABS: 3.8 10*3/uL (ref 0.7–4.0)
Lymphocytes Relative: 37 % (ref 12–46)
MCH: 30 pg (ref 26.0–34.0)
MCHC: 32.3 g/dL (ref 30.0–36.0)
MCV: 92.7 fL (ref 78.0–100.0)
MONO ABS: 0.7 10*3/uL (ref 0.1–1.0)
MONOS PCT: 7 % (ref 3–12)
Neutro Abs: 5.7 10*3/uL (ref 1.7–7.7)
Neutrophils Relative %: 54 % (ref 43–77)
PLATELETS: 112 10*3/uL — AB (ref 150–400)
RBC: 5.34 MIL/uL — ABNORMAL HIGH (ref 3.87–5.11)
RDW: 17.1 % — AB (ref 11.5–15.5)
WBC: 10.4 10*3/uL (ref 4.0–10.5)

## 2014-02-28 LAB — DIGOXIN LEVEL: Digoxin Level: <0.3 ng/mL — ABNORMAL LOW (ref 0.8–2.0)

## 2014-02-28 LAB — COMPREHENSIVE METABOLIC PANEL
ALBUMIN: 3.3 g/dL — AB (ref 3.5–5.2)
ALT: 11 U/L (ref 0–35)
AST: 30 U/L (ref 0–37)
Alkaline Phosphatase: 72 U/L (ref 39–117)
BUN: 73 mg/dL — ABNORMAL HIGH (ref 6–23)
CALCIUM: 10.1 mg/dL (ref 8.4–10.5)
CO2: 25 mEq/L (ref 19–32)
Chloride: 120 mEq/L — ABNORMAL HIGH (ref 96–112)
Creatinine, Ser: 2.32 mg/dL — ABNORMAL HIGH (ref 0.50–1.10)
GFR calc Af Amer: 21 mL/min — ABNORMAL LOW (ref >90)
GFR calc non Af Amer: 18 mL/min — ABNORMAL LOW (ref >90)
Glucose, Bld: 142 mg/dL — ABNORMAL HIGH (ref 70–99)
Potassium: 5.1 mEq/L (ref 3.7–5.3)
SODIUM: 162 meq/L — AB (ref 137–147)
TOTAL PROTEIN: 7.2 g/dL (ref 6.0–8.3)
Total Bilirubin: 0.6 mg/dL (ref 0.3–1.2)

## 2014-02-28 LAB — GLUCOSE, CAPILLARY
GLUCOSE-CAPILLARY: 140 mg/dL — AB (ref 70–99)
Glucose-Capillary: 129 mg/dL — ABNORMAL HIGH (ref 70–99)
Glucose-Capillary: 130 mg/dL — ABNORMAL HIGH (ref 70–99)

## 2014-02-28 LAB — I-STAT CHEM 8, ED
BUN: 77 mg/dL — AB (ref 6–23)
CALCIUM ION: 1.15 mmol/L (ref 1.13–1.30)
CREATININE: 2.6 mg/dL — AB (ref 0.50–1.10)
Chloride: 121 mEq/L — ABNORMAL HIGH (ref 96–112)
GLUCOSE: 139 mg/dL — AB (ref 70–99)
HCT: 51 % — ABNORMAL HIGH (ref 36.0–46.0)
Hemoglobin: 17.3 g/dL — ABNORMAL HIGH (ref 12.0–15.0)
Potassium: 4.7 mEq/L (ref 3.7–5.3)
Sodium: 162 mEq/L — ABNORMAL HIGH (ref 137–147)
TCO2: 29 mmol/L (ref 0–100)

## 2014-02-28 LAB — I-STAT CG4 LACTIC ACID, ED: Lactic Acid, Venous: 1.94 mmol/L (ref 0.5–2.2)

## 2014-02-28 LAB — I-STAT TROPONIN, ED: TROPONIN I, POC: 0.21 ng/mL — AB (ref 0.00–0.08)

## 2014-02-28 LAB — URINE MICROSCOPIC-ADD ON

## 2014-02-28 LAB — VALPROIC ACID LEVEL: Valproic Acid Lvl: <10 ug/mL — ABNORMAL LOW (ref 50.0–100.0)

## 2014-02-28 LAB — TROPONIN I

## 2014-02-28 MED ORDER — DEXTROSE 5 % IV SOLN
1.0000 g | Freq: Once | INTRAVENOUS | Status: AC
  Administered 2014-02-28: 1 g via INTRAVENOUS
  Filled 2014-02-28: qty 10

## 2014-02-28 MED ORDER — ESOMEPRAZOLE MAGNESIUM 40 MG PO PACK: 40.0000 mg | PACK | Freq: Every day | ORAL | Status: DC

## 2014-02-28 MED ORDER — ACETAMINOPHEN 325 MG PO TABS: 650.0000 mg | ORAL_TABLET | Freq: Four times a day (QID) | ORAL | Status: DC | PRN

## 2014-02-28 MED ORDER — SODIUM CHLORIDE 0.9 % IV SOLN
INTRAVENOUS | Status: DC
  Administered 2014-02-28: 1000 mL via INTRAVENOUS

## 2014-02-28 MED ORDER — ONDANSETRON HCL 4 MG/2ML IJ SOLN: 4.0000 mg | Freq: Four times a day (QID) | INTRAMUSCULAR | Status: DC | PRN

## 2014-02-28 MED ORDER — SIMVASTATIN 40 MG PO TABS
40.0000 mg | ORAL_TABLET | Freq: Every day | ORAL | Status: DC
  Administered 2014-03-01 – 2014-03-02 (×2): 40 mg via ORAL
  Filled 2014-02-28 (×4): qty 1

## 2014-02-28 MED ORDER — INSULIN GLARGINE 100 UNIT/ML ~~LOC~~ SOLN
10.0000 [IU] | Freq: Every day | SUBCUTANEOUS | Status: DC
  Administered 2014-02-28: 10 [IU] via SUBCUTANEOUS
  Filled 2014-02-28 (×2): qty 0.1

## 2014-02-28 MED ORDER — ALUM & MAG HYDROXIDE-SIMETH 200-200-20 MG/5ML PO SUSP
30.0000 mL | Freq: Four times a day (QID) | ORAL | Status: DC | PRN
Start: 2014-02-28 — End: 2014-03-06

## 2014-02-28 MED ORDER — METOPROLOL SUCCINATE ER 100 MG PO TB24
100.0000 mg | ORAL_TABLET | Freq: Every day | ORAL | Status: DC
  Administered 2014-03-01 – 2014-03-03 (×3): 100 mg via ORAL
  Filled 2014-02-28 (×3): qty 1

## 2014-02-28 MED ORDER — PANTOPRAZOLE SODIUM 40 MG PO PACK
40.0000 mg | PACK | Freq: Every day | ORAL | Status: DC
  Administered 2014-03-01 – 2014-03-03 (×3): 40 mg via ORAL
  Filled 2014-02-28 (×3): qty 20

## 2014-02-28 MED ORDER — LACTATED RINGERS IV SOLN: INTRAVENOUS | Status: AC

## 2014-02-28 MED ORDER — SODIUM CHLORIDE 0.9 % IV BOLUS (SEPSIS)
1000.0000 mL | Freq: Once | INTRAVENOUS | Status: AC
  Administered 2014-02-28: 1000 mL via INTRAVENOUS

## 2014-02-28 MED ORDER — HEPARIN SODIUM (PORCINE) 5000 UNIT/ML IJ SOLN
5000.0000 [IU] | Freq: Three times a day (TID) | INTRAMUSCULAR | Status: DC
  Administered 2014-02-28 – 2014-03-01 (×5): 5000 [IU] via SUBCUTANEOUS
  Filled 2014-02-28 (×9): qty 1

## 2014-02-28 MED ORDER — DONEPEZIL HCL 10 MG PO TABS
10.0000 mg | ORAL_TABLET | Freq: Every day | ORAL | Status: DC
  Administered 2014-03-01 – 2014-03-05 (×4): 10 mg via ORAL
  Filled 2014-02-28 (×7): qty 1

## 2014-02-28 MED ORDER — ALBUTEROL SULFATE (2.5 MG/3ML) 0.083% IN NEBU: 2.5000 mg | INHALATION_SOLUTION | RESPIRATORY_TRACT | Status: DC | PRN

## 2014-02-28 MED ORDER — LACTATED RINGERS IV SOLN
INTRAVENOUS | Status: DC
  Administered 2014-02-28: 1000 mL via INTRAVENOUS
  Administered 2014-02-28: 999 mL via INTRAVENOUS

## 2014-02-28 MED ORDER — ADULT MULTIVITAMIN W/MINERALS CH
1.0000 | ORAL_TABLET | Freq: Every day | ORAL | Status: DC
  Administered 2014-03-01 – 2014-03-03 (×3): 1 via ORAL
  Filled 2014-02-28 (×3): qty 1

## 2014-02-28 MED ORDER — HYDRALAZINE HCL 10 MG PO TABS
15.0000 mg | ORAL_TABLET | Freq: Three times a day (TID) | ORAL | Status: DC
  Administered 2014-03-01 – 2014-03-03 (×6): 15 mg via ORAL
  Filled 2014-02-28 (×11): qty 2

## 2014-02-28 MED ORDER — DIGOXIN 125 MCG PO TABS
0.1250 mg | ORAL_TABLET | Freq: Every day | ORAL | Status: DC
  Administered 2014-03-01 – 2014-03-03 (×3): 0.125 mg via ORAL
  Filled 2014-02-28 (×3): qty 1

## 2014-02-28 MED ORDER — DONEPEZIL HCL 10 MG PO TBDP: 10.0000 mg | ORAL_TABLET | Freq: Every day | ORAL | Status: DC

## 2014-02-28 MED ORDER — CLONIDINE HCL 0.3 MG/24HR TD PTWK
0.3000 mg | MEDICATED_PATCH | TRANSDERMAL | Status: DC
  Administered 2014-02-28: 0.3 mg via TRANSDERMAL
  Filled 2014-02-28: qty 1

## 2014-02-28 MED ORDER — POLYVINYL ALCOHOL 1.4 % OP SOLN
1.0000 [drp] | Freq: Four times a day (QID) | OPHTHALMIC | Status: DC
  Administered 2014-02-28 – 2014-03-06 (×17): 1 [drp] via OPHTHALMIC
  Filled 2014-02-28 (×3): qty 15

## 2014-02-28 MED ORDER — DIVALPROEX SODIUM 125 MG PO CPSP
250.0000 mg | ORAL_CAPSULE | Freq: Two times a day (BID) | ORAL | Status: DC
  Administered 2014-03-01 – 2014-03-05 (×10): 250 mg via ORAL
  Filled 2014-02-28 (×13): qty 2

## 2014-02-28 MED ORDER — ONDANSETRON HCL 4 MG PO TABS: 4.0000 mg | ORAL_TABLET | Freq: Four times a day (QID) | ORAL | Status: DC | PRN

## 2014-02-28 MED ORDER — ACETAMINOPHEN 650 MG RE SUPP: 650.0000 mg | Freq: Four times a day (QID) | RECTAL | Status: DC | PRN

## 2014-02-28 MED ORDER — HYDRALAZINE HCL 20 MG/ML IJ SOLN: 5.0000 mg | Freq: Four times a day (QID) | INTRAMUSCULAR | Status: DC | PRN

## 2014-02-28 NOTE — ED Notes (Signed)
Returned from ct 

## 2014-02-28 NOTE — ED Notes (Signed)
Patient transported to CT 

## 2014-02-28 NOTE — H&P (Addendum)
[facility] History and Physical  KAEDENCE CONNELLY MWU:[id number] DOB: 1927/04/14 DOA: 02/28/2014  PCP: Ezequiel Kayser, MD    Chief Complaint:  Sent from nursing home for "AMS",  lethargy and high sodium level  HPI: [name] is a 80 y.o. female with PMH as below who is unable to give me a history and no family is available. The history is obtained from the ER PA report and her chart. [er the ER PA who spoke with the son, apparently she has been confused and not eating well for weeks. I have called her son but he is not picking up the phone.  Pt thinks she in in her 2s. She is not aware of where she is either. I am unable to obtain further history  ROS - unable to obtain- she is confused    Past Medical History  Diagnosis Date  . CVD (cerebrovascular disease)   . Hypertension   . Diabetes mellitus without complication   . High cholesterol   . Alzheimer's dementia   . Arthritis    History reviewed. No pertinent past surgical history. Social History:  reports that she has never smoked. She does not have any smokeless tobacco history on file. She reports that she does not drink alcohol or use illicit drugs. Lives at a [facility]   No Known Allergies  No family history on file.    Prior to Admission medications   Medication Sig Start Date End Date Taking? Authorizing Provider  acetaminophen (TYLENOL) 500 MG tablet Take 500 mg by mouth every 4 (four) hours as needed for mild pain.   Yes Historical Provider, MD  albuterol (PROVENTIL) (2.5 MG/3ML) 0.083% nebulizer solution Take 2.5 mg by nebulization every 4 (four) hours as needed for wheezing or shortness of breath.   Yes Historical Provider, MD  cetirizine (ZYRTEC) 10 MG tablet Take 10 mg by mouth daily.   Yes Historical Provider, MD  cloNIDine (CATAPRES - DOSED IN MG/24 HR) 0.3 mg/24hr patch Place 0.3 mg onto the skin once a week. On fridays   Yes Historical Provider, MD  Dextromethorphan-Quinidine  (NUEDEXTA) 20-10 MG CAPS Take 1 capsule by mouth 2 (two) times daily.   Yes Historical Provider, MD  digoxin (LANOXIN) 0.125 MG tablet Take 0.125 mg by mouth daily.   Yes Historical Provider, MD  divalproex (DEPAKOTE SPRINKLE) 125 MG capsule Take 250 mg by mouth 2 (two) times daily.   Yes Historical Provider, MD  donepezil (ARICEPT ODT) 10 MG disintegrating tablet Take 10 mg by mouth at bedtime.   Yes Historical Provider, MD  esomeprazole (NEXIUM) 40 MG packet Take 40 mg by mouth daily before breakfast.   Yes Historical Provider, MD  furosemide (LASIX) 20 MG tablet Take 60 mg by mouth daily.   Yes Historical Provider, MD  Ginkgo Biloba 40 MG TABS Take 120 mg by mouth at bedtime.   Yes Historical Provider, MD  glucosamine-chondroitin 500-400 MG tablet Take 2 tablets by mouth daily.   Yes Historical Provider, MD  hydrALAZINE (APRESOLINE) 10 MG tablet Take 15 mg by mouth 3 (three) times daily.   Yes Historical Provider, MD  insulin glargine (LANTUS) 100 UNIT/ML injection Inject 0.1 mLs (10 Units total) into the skin at bedtime. 01/23/13  Yes Adeline C Viyuoh, MD  insulin lispro (HUMALOG) 100 UNIT/ML injection Inject 2-8 Units into the skin 3 (three) times daily before meals. Sliding scale   Yes Historical Provider, MD  lisinopril (PRINIVIL,ZESTRIL) 40 MG tablet Take 40 mg by  mouth daily.   Yes Historical Provider, MD  loperamide (IMODIUM A-D) 2 MG tablet Take 2 mg by mouth 4 (four) times daily as needed for diarrhea or loose stools.   Yes Historical Provider, MD  LORazepam (ATIVAN) 0.5 MG tablet Take 0.5 mg by mouth every 8 (eight) hours.   Yes Historical Provider, MD  LORazepam (ATIVAN) 0.5 MG tablet Take 0.5 mg by mouth daily as needed for anxiety.   Yes Historical Provider, MD  metoprolol succinate (TOPROL-XL) 100 MG 24 hr tablet Take 100 mg by mouth daily. Take with or immediately following a meal.   Yes Historical Provider, MD  Multiple Vitamin (MULTIVITAMIN WITH MINERALS) TABS Take 1 tablet by  mouth daily.   Yes Historical Provider, MD  polyvinyl alcohol (LIQUIFILM TEARS) 1.4 % ophthalmic solution Place 1 drop into both eyes 4 (four) times daily.   Yes Historical Provider, MD  simvastatin (ZOCOR) 40 MG tablet Take 40 mg by mouth daily.   Yes Historical Provider, MD  traMADol (ULTRAM) 50 MG tablet Take 50 mg by mouth every 6 (six) hours as needed for moderate pain.   Yes Historical Provider, MD     Physical Exam: Vitals Filed Vitals:   02/28/14 0929 02/28/14 0936 02/28/14 1027 02/28/14 1122  BP: 128/71 128/71 133/93   Pulse: 71 95 79   Temp:    94.5 F (34.7 C)  TempSrc:    Axillary  Resp: 14 17 18    Weight:    66.316 kg (146 lb 3.2 oz)  SpO2: 99% 99% 100%      General: sleepy but awakens and talks to me- confused, appears comfortable HEENT: Normocephalic and Atraumatic, Mucous membranes dry                PERRLA; EOM intact; No scleral icterus,                 Nares: Patent, Oropharynx: Clear, Fair Dentition                 Neck: FROM, no cervical lymphadenopathy, thyromegaly, carotid bruit or JVD;  Breasts: deferred CHEST WALL: No tenderness  CHEST: Normal respiration, clear to auscultation bilaterally  HEART: Regular rate and rhythm; no murmurs rubs or gallops  BACK: No kyphosis or scoliosis; no CVA tenderness  ABDOMEN: Positive Bowel Sounds, soft, non-tender; no masses, no organomegaly Rectal Exam: deferred EXTREMITIES: No cyanosis, clubbing, or edema Genitalia: not examined  SKIN:  no rash or ulceration  CNS: Alert and Oriented x 4, Nonfocal exam, CN 2-12 intact  Labs on Admission:  Basic Metabolic Panel:  Recent Labs Lab 02/28/14 0700 02/28/14 0701  NA 162* 162*  K 5.1 4.7  CL 120* 121*  CO2 25  --   GLUCOSE 142* 139*  BUN 73* 77*  CREATININE 2.32* 2.60*  CALCIUM 10.1  --    Liver Function Tests:  Recent Labs Lab 02/28/14 0700  AST 30  ALT 11  ALKPHOS 72  BILITOT 0.6  PROT 7.2  ALBUMIN 3.3*   No results found for this basename:  LIPASE, AMYLASE,  in the last 168 hours No results found for this basename: AMMONIA,  in the last 168 hours CBC:  Recent Labs Lab 02/28/14 0700 02/28/14 0701  WBC 10.4  --   NEUTROABS 5.7  --   HGB 16.0* 17.3*  HCT 49.5* 51.0*  MCV 92.7  --   PLT 112*  --    Cardiac Enzymes:  Recent Labs Lab 02/28/14 0948  TROPONINI <0.30  BNP (last 3 results) No results found for this basename: PROBNP,  in the last 8760 hours CBG:  Recent Labs Lab 02/28/14 1119  GLUCAP 129*    Radiological Exams on Admission: Ct Head Wo Contrast  02/28/2014   CLINICAL DATA:  Altered mental status  EXAM: CT HEAD WITHOUT CONTRAST  TECHNIQUE: Contiguous axial images were obtained from the base of the skull through the vertex without intravenous contrast.  COMPARISON:  Jan 21, 2013 head CT and Jan 22, 2013 brain MRI  FINDINGS: There is moderate diffuse atrophy there is no appreciable mass, hemorrhage, extra-axial fluid collection, or midline shift. There has been evolution of the infarct in the posterior left frontal lobe seen on prior examinations. Decreased attenuation is noted throughout this area currently. Currently there is widespread periventricular small vessel disease throughout the centra semiovale bilaterally. There are small prior lacunar infarcts in both external capsules as well as in the left and right alignment regions. The small posterior cerebellar infarct on the right seen as an acute finding on the previous study is not well seen on this current examination. There is some mild small vessel disease in the dentate nuclei of these cerebellar hemisphere.  No acute infarct is appreciable on this study.  The bony calvarium appears intact. The mastoid air cells are clear. There is diffuse opacification of left-sided mastoid air cells. There is opacification of the left middle ear, a new finding as well.  IMPRESSION: Atrophy with extensive small vessel disease and prior infarcts. There is no intracranial  mass, hemorrhage, or acute appearing infarct currently. There is extensive left-sided mastoid air cell disease. There is also opacification in the left middle ear, a new finding.   Electronically Signed   By: Lowella Grip M.D.   On: 02/28/2014 07:56   Dg Chest Portable 1 View  02/28/2014   CLINICAL DATA:  Fatigue.  Altered mental status.  EXAM: PORTABLE CHEST - 1 VIEW  COMPARISON:  12/06/2010  FINDINGS: Chronic elevation of right hemidiaphragm noted. Mild cardiomegaly is stable. Both lungs are clear.  IMPRESSION: Stable mild cardiomegaly and elevation of right hemidiaphragm. No acute findings.   Electronically Signed   By: Earle Gell M.D.   On: 02/28/2014 07:12    EKG: Independently reviewed. A-fib with LAD, LVH,  IVCD, prolonged QTc  Assessment/Plan Principal Problem:   Hypernatremia - hold lasix, encourage fluids, start NS (has received 1 L in ER) and follow PO intake intake carefully - if she has had poor PO intake leading up to this, and currently in the hospital, may need to consider hospice  Active Problems: AKI on CKD - most likely prerenal- Cr on 5/14 /15 was 1.2 - cont to hydrate and follow renal indicies  Elevated Troponin POC - repeat troponin negative-   A-fib - no history of this- currently rate controlled- will need to ambulate to see if she is a candidate for anticoagulation- will place on a baby ASA for now - cont b blocker for rate control - ECHO done in May of last yr  Grade 2 diastolic dysfuction - slow IVF- follow pulse ox- cont b blocker -hold lasix  Polycythemia? - likely hemoconcentration  Thrombocytopenia - maybe from gr neg UTI   UTI - Urine sent for culture - started on Rocephin - she grew out E coli on 5/12 which was pan-sensitve    Alzheimer's dementia Cont Aricept    HTN (hypertension) - cont home meds except for Lasix and ACE I  -on clonidine, hydralazine  and Metoprolol    Hypercholesterolemia - cont Simvastatin    Type II or  unspecified type diabetes mellitus with peripheral circulatory disorders, uncontrolled(250.72) - on Lantus 10 U at home- will cont - also on low dose inslun with meal ("3-6" with meals per med rec) - place on very low dose sliding scale     Consulted: none  Code Status: Full code for now- unable to speak with any family Family Communication: none  Disposition Plan: to be determined   Time spent: > 45 min  RIZWAN,SAIMA, MD [facility]  If 7PM-7AM, please contact night-coverage [email or web address] 02/28/2014, 1:26 PM

## 2014-02-28 NOTE — ED Notes (Signed)
Family leaving at this time.

## 2014-02-28 NOTE — ED Notes (Signed)
Here by EMS from [facility], here for lethargy, AMS, and abnormal lab work (sodium high), gradually progressively worse over days & weeks (unspecific), pt arrives lethargic, generally weak, some moaning, arousable to voice, some verbal interaction, mostly reacting to discomfort, EMS unable to establish IV, son at [facility] upon arrival, EDPA into room upon arrival, BP noted to be low 70/53. other VSS.

## 2014-02-28 NOTE — ED Notes (Addendum)
Regenia Skeeter, MD notified of abnormal lab test results

## 2014-02-28 NOTE — Progress Notes (Signed)
UR completed. Jayzon Taras RN CCM Case Mgmt phone [phone] 

## 2014-02-28 NOTE — ED Provider Notes (Signed)
CSN: [id number]     Arrival date & time 02/28/14  3557 History   First MD Initiated Contact with Patient 02/28/14 0630     Chief Complaint  Patient presents with  . Fatigue  . Altered Mental Status     (Consider location/radiation/quality/duration/timing/severity/associated sxs/prior Treatment) HPI [name] is a 80 y.o. female coming from a nursing home with AMS. Pt with history of prior CVA and dementia. History provided by son and EMS. According to her son, pt has been more altered for several months, and it worsened in the last few days. He states pt is at baseline non ambulatory and confused. States in the last few weeks she has not been able to sit in the wheelchair. She is no longer able to carry on a conversation. She has been sleeping and not speaking in the last few days. Labs obtained at the nursing home and sent here for sodium of 162, creatinine 2.5. No fever reported. No n/v/d. No cough   Past Medical History  Diagnosis Date  . CVD (cerebrovascular disease)   . Hypertension   . Diabetes mellitus without complication   . High cholesterol   . Alzheimer's dementia   . Arthritis    History reviewed. No pertinent past surgical history. No family history on file. History  Substance Use Topics  . Smoking status: Never Smoker   . Smokeless tobacco: Not on file  . Alcohol Use: No   OB History   Grav Para Term Preterm Abortions TAB SAB Ect Mult Living                 Review of Systems  Unable to perform ROS: Mental status change  All other systems reviewed and are negative.     Allergies  Review of patient's allergies indicates no known allergies.  Home Medications   Prior to Admission medications   Medication Sig Start Date End Date Taking? Authorizing Provider  acetaminophen (TYLENOL) 500 MG tablet Take 500 mg by mouth every 4 (four) hours as needed for mild pain.   Yes Historical Provider, MD  albuterol (PROVENTIL) (2.5 MG/3ML) 0.083% nebulizer solution  Take 2.5 mg by nebulization every 4 (four) hours as needed for wheezing or shortness of breath.   Yes Historical Provider, MD  cetirizine (ZYRTEC) 10 MG tablet Take 10 mg by mouth daily.   Yes Historical Provider, MD  cloNIDine (CATAPRES - DOSED IN MG/24 HR) 0.3 mg/24hr patch Place 0.3 mg onto the skin once a week. On fridays   Yes Historical Provider, MD  Dextromethorphan-Quinidine (NUEDEXTA) 20-10 MG CAPS Take 1 capsule by mouth 2 (two) times daily.   Yes Historical Provider, MD  digoxin (LANOXIN) 0.125 MG tablet Take 0.125 mg by mouth daily.   Yes Historical Provider, MD  divalproex (DEPAKOTE SPRINKLE) 125 MG capsule Take 250 mg by mouth 2 (two) times daily.   Yes Historical Provider, MD  donepezil (ARICEPT ODT) 10 MG disintegrating tablet Take 10 mg by mouth at bedtime.   Yes Historical Provider, MD  esomeprazole (NEXIUM) 40 MG packet Take 40 mg by mouth daily before breakfast.   Yes Historical Provider, MD  furosemide (LASIX) 20 MG tablet Take 60 mg by mouth daily.   Yes Historical Provider, MD  Ginkgo Biloba 40 MG TABS Take 120 mg by mouth at bedtime.   Yes Historical Provider, MD  glucosamine-chondroitin 500-400 MG tablet Take 2 tablets by mouth daily.   Yes Historical Provider, MD  hydrALAZINE (APRESOLINE) 10 MG tablet Take 15  mg by mouth 3 (three) times daily.   Yes Historical Provider, MD  insulin glargine (LANTUS) 100 UNIT/ML injection Inject 0.1 mLs (10 Units total) into the skin at bedtime. 01/23/13  Yes Adeline C Viyuoh, MD  insulin lispro (HUMALOG) 100 UNIT/ML injection Inject 2-8 Units into the skin 3 (three) times daily before meals. Sliding scale   Yes Historical Provider, MD  lisinopril (PRINIVIL,ZESTRIL) 40 MG tablet Take 40 mg by mouth daily.   Yes Historical Provider, MD  loperamide (IMODIUM A-D) 2 MG tablet Take 2 mg by mouth 4 (four) times daily as needed for diarrhea or loose stools.   Yes Historical Provider, MD  LORazepam (ATIVAN) 0.5 MG tablet Take 0.5 mg by mouth every 8  (eight) hours.   Yes Historical Provider, MD  LORazepam (ATIVAN) 0.5 MG tablet Take 0.5 mg by mouth daily as needed for anxiety.   Yes Historical Provider, MD  metoprolol succinate (TOPROL-XL) 100 MG 24 hr tablet Take 100 mg by mouth daily. Take with or immediately following a meal.   Yes Historical Provider, MD  Multiple Vitamin (MULTIVITAMIN WITH MINERALS) TABS Take 1 tablet by mouth daily.   Yes Historical Provider, MD  polyvinyl alcohol (LIQUIFILM TEARS) 1.4 % ophthalmic solution Place 1 drop into both eyes 4 (four) times daily.   Yes Historical Provider, MD  simvastatin (ZOCOR) 40 MG tablet Take 40 mg by mouth daily.   Yes Historical Provider, MD  traMADol (ULTRAM) 50 MG tablet Take 50 mg by mouth every 6 (six) hours as needed for moderate pain.   Yes Historical Provider, MD   BP 134/62  Pulse 93  Resp 14  SpO2 100% Physical Exam  Nursing note and vitals reviewed. Constitutional: She appears well-developed and well-nourished. No distress.  HENT:  Head: Normocephalic.  Oral mucosa dry  Eyes: Conjunctivae are normal.  Neck: Neck supple.  Cardiovascular: Normal rate, regular rhythm, normal heart sounds and intact distal pulses.   Pulmonary/Chest: Effort normal and breath sounds normal. No respiratory distress. She has no wheezes. She has no rales.  Abdominal: Soft. Bowel sounds are normal. She exhibits no distension. There is no tenderness. There is no rebound.  Musculoskeletal: She exhibits no edema.  Neurological: She is alert.  Disoriented. Moves bilateral upper extremities, left grip strength weaker than right. Does not follow directions. Withdraws to pain. Reflexes 2+ bialterally  Skin: Skin is warm and dry.  Psychiatric: She has a normal mood and affect. Her behavior is normal.    ED Course  Procedures (including critical care time) Labs Review Labs Reviewed  CBC WITH DIFFERENTIAL - Abnormal; Notable for the following:    RBC 5.34 (*)    Hemoglobin 16.0 (*)    HCT 49.5  (*)    RDW 17.1 (*)    All other components within normal limits  I-STAT TROPOININ, ED - Abnormal; Notable for the following:    Troponin i, poc 0.21 (*)    All other components within normal limits  I-STAT CHEM 8, ED - Abnormal; Notable for the following:    Sodium 162 (*)    Chloride 121 (*)    BUN 77 (*)    Creatinine, Ser 2.60 (*)    Glucose, Bld 139 (*)    Hemoglobin 17.3 (*)    HCT 51.0 (*)    All other components within normal limits  URINE CULTURE  CULTURE, BLOOD (ROUTINE X 2)  CULTURE, BLOOD (ROUTINE X 2)  COMPREHENSIVE METABOLIC PANEL  URINALYSIS, ROUTINE W REFLEX MICROSCOPIC  DIGOXIN LEVEL  VALPROIC ACID LEVEL  I-STAT CG4 LACTIC ACID, ED    Imaging Review Dg Chest Portable 1 View  02/28/2014   CLINICAL DATA:  Fatigue.  Altered mental status.  EXAM: PORTABLE CHEST - 1 VIEW  COMPARISON:  12/06/2010  FINDINGS: Chronic elevation of right hemidiaphragm noted. Mild cardiomegaly is stable. Both lungs are clear.  IMPRESSION: Stable mild cardiomegaly and elevation of right hemidiaphragm. No acute findings.   Electronically Signed   By: Earle Gell M.D.   On: 02/28/2014 07:12     EKG Interpretation   Date/Time:  Friday February 28 2014 07:05:36 EDT Ventricular Rate:  121 PR Interval:    QRS Duration: 128 QT Interval:  415 QTC Calculation: 589 R Axis:   -84 Text Interpretation:  Atrial fibrillation Nonspecific IVCD with LAD LVH  with secondary repolarization abnormality Anterior infarct, old Baseline  wander in lead(s) V1 V3 conduction delay is not new Confirmed by GOLDSTON   MD, SCOTT ([id number]) on 02/28/2014 7:10:12 AM      MDM   Final diagnoses:  Hypernatremia  Renal failure  Somnolence  UTI (lower urinary tract infection)  Elevated troponin    Pt with altered mental status. Initially hypotensive. BP came up with IV fluids. Labs and UA, CT head all pending.    9:09 AM Labs showing elevated sodium of 162, switched to lactated ringer fluids. Elevated creat and  BUN. Pt appears dry. Will continue to hydrate. BP and VS remain normal. Trop bumped, could be related to renal insufficiency, will need cycling. UA infected, rocephin ordered, 1g. CT head and CXR with no acute findings.  Spoke with [facility], will admit.   Filed Vitals:   02/28/14 0725 02/28/14 0756 02/28/14 0811 02/28/14 0844  BP: 134/62 110/71  130/57  Pulse: 93 98  87  Temp:   97.8 F (36.6 C)   TempSrc:   Rectal   Resp: 14 20  15   SpO2: 100% 100%  99%       Renold Genta, PA-C 02/28/14 0913

## 2014-02-28 NOTE — ED Notes (Signed)
Attempted report 

## 2014-03-01 DIAGNOSIS — N179 Acute kidney failure, unspecified: Principal | ICD-10-CM

## 2014-03-01 LAB — COMPREHENSIVE METABOLIC PANEL
ALT: 11 U/L (ref 0–35)
AST: 24 U/L (ref 0–37)
Albumin: 3 g/dL — ABNORMAL LOW (ref 3.5–5.2)
Alkaline Phosphatase: 71 U/L (ref 39–117)
BILIRUBIN TOTAL: 0.4 mg/dL (ref 0.3–1.2)
BUN: 48 mg/dL — AB (ref 6–23)
CALCIUM: 9.3 mg/dL (ref 8.4–10.5)
CHLORIDE: 121 meq/L — AB (ref 96–112)
CO2: 24 mEq/L (ref 19–32)
CREATININE: 1.51 mg/dL — AB (ref 0.50–1.10)
GFR calc Af Amer: 35 mL/min — ABNORMAL LOW (ref >90)
GFR calc non Af Amer: 30 mL/min — ABNORMAL LOW (ref >90)
Glucose, Bld: 81 mg/dL (ref 70–99)
Potassium: 3.6 mEq/L — ABNORMAL LOW (ref 3.7–5.3)
Sodium: 162 mEq/L — ABNORMAL HIGH (ref 137–147)
TOTAL PROTEIN: 6.6 g/dL (ref 6.0–8.3)

## 2014-03-01 LAB — GLUCOSE, CAPILLARY
Glucose-Capillary: 45 mg/dL — ABNORMAL LOW (ref 70–99)
Glucose-Capillary: 106 mg/dL — ABNORMAL HIGH (ref 70–99)
Glucose-Capillary: 53 mg/dL — ABNORMAL LOW (ref 70–99)
Glucose-Capillary: 127 mg/dL — ABNORMAL HIGH (ref 70–99)
Glucose-Capillary: 136 mg/dL — ABNORMAL HIGH (ref 70–99)

## 2014-03-01 MED ORDER — DEXTROSE 50 % IV SOLN: 25.0000 mL | Freq: Once | INTRAVENOUS | Status: AC | PRN

## 2014-03-01 MED ORDER — SODIUM CHLORIDE 0.45 % IV SOLN
INTRAVENOUS | Status: DC
  Administered 2014-03-01 (×2): via INTRAVENOUS

## 2014-03-01 MED ORDER — DEXTROSE 50 % IV SOLN: 50.0000 mL | Freq: Once | INTRAVENOUS | Status: AC | PRN

## 2014-03-01 MED ORDER — POTASSIUM CHLORIDE 10 MEQ/100ML IV SOLN
10.0000 meq | INTRAVENOUS | Status: AC
  Administered 2014-03-01 (×4): 10 meq via INTRAVENOUS
  Filled 2014-03-01 (×4): qty 100

## 2014-03-01 MED ORDER — INSULIN ASPART 100 UNIT/ML ~~LOC~~ SOLN: 0.0000 [IU] | Freq: Three times a day (TID) | SUBCUTANEOUS | Status: DC

## 2014-03-01 MED ORDER — DEXTROSE 50 % IV SOLN
INTRAVENOUS | Status: AC
  Administered 2014-03-01: 50 mL
  Filled 2014-03-01: qty 50

## 2014-03-01 NOTE — ED Provider Notes (Signed)
Medical screening examination/treatment/procedure(s) were conducted as a shared visit with non-physician practitioner(s) and myself.  I personally evaluated the patient during the encounter.   EKG Interpretation   Date/Time:  Friday February 28 2014 07:05:36 EDT Ventricular Rate:  121 PR Interval:    QRS Duration: 128 QT Interval:  415 QTC Calculation: 589 R Axis:   -84 Text Interpretation:  Atrial fibrillation Nonspecific IVCD with LAD LVH  with secondary repolarization abnormality Anterior infarct, old Baseline  wander in lead(s) V1 V3 conduction delay is not new Confirmed by GOLDSTON   MD, SCOTT ([id number]) on 02/28/2014 7:10:12 AM       Patient with altered mental status and hypernatremia. This is likely due to dehydration given her exam and renal insufficiency. Also present a UTI, will give Rocephin and admit to the hospitalist.  Ephraim Hamburger, MD 03/01/14 1925

## 2014-03-01 NOTE — Evaluation (Signed)
Clinical/Bedside Swallow Evaluation Patient Details  Name: [name] MRN: [id number] Date of Birth: Nov 27, 1926  Today's Date: 03/01/2014 Time: 0630-1601 SLP Time Calculation (min): 30 min  Past Medical History:  Past Medical History  Diagnosis Date  . CVD (cerebrovascular disease)   . Hypertension   . Diabetes mellitus without complication   . High cholesterol   . Alzheimer's dementia   . Arthritis    Past Surgical History: History reviewed. No pertinent past surgical history. HPI:  Admitted to ED from SNF with AMS.   BSE ordered secondary to nursing reporting pocketing and difficulty swallow pills and due to [place] significant for dysphagia.  Review of Nursing MAR from facility patient on puree and nectar thick liquids.  Nursing from facility where patient resides reports patient on mechanical soft and thin liquids.  MBS completed 07/25/12 with diet recommendations of dysphagia 3 and thin liquids.    Assessment / Plan / Recommendation Clinical Impression  BSE completed.  Poor oral awareness with soft solid trials with oral holding requiring verbal cues to initiate mastication.  No outward clinical s/s of aspiration noted throughout evaluation.  Due to oral phase recommend to downgrade to dysphagia 2 (finely chopped) and continue thin liquids.  Recommend full supervision with all meals to assist and cue patient PRN to ensure safety.    Swallow treatment completed following results of evaluation focusing on providing recommendations to caregivers.    ST to sign off as education complete.     Aspiration Risk  Mild    Diet Recommendation Dysphagia 2 (Fine chop);Thin liquid   Liquid Administration via: Cup;Straw Medication Administration: Crushed with puree Supervision: Staff to assist with self feeding;Full supervision/cueing for compensatory strategies Compensations: Small sips/bites;Check for pocketing Postural Changes and/or Swallow Maneuvers: Out of bed for meals;Seated upright 90  degrees;Upright 30-60 min after meal    Other  Recommendations Oral Care Recommendations: Oral care Q4 per protocol   Follow Up Recommendations  Skilled Nursing facility         Swallow Study Prior Functional Status   Resident of SNF on mechanical soft consistency and thin liquids per RN at facility     General Date of Onset: 02/28/14 HPI: Admitted to ED from SNF with AMS.   Type of Study: Bedside swallow evaluation Previous Swallow Assessment: MBS 07/25/12 dyspagia 3 and thin liquids,  BSE 01/22/14 dysphagia 3/thin liquids  Diet Prior to this Study: Dysphagia 3 (soft);Thin liquids Respiratory Status: Room air History of Recent Intubation: No Behavior/Cognition: Alert;Cooperative;Confused;Requires cueing;Distractible;Decreased sustained attention Oral Cavity - Dentition: Poor condition;Missing dentition Self-Feeding Abilities: Needs assist Patient Positioning: Upright in chair Baseline Vocal Quality: Clear Volitional Cough: Cognitively unable to elicit Volitional Swallow: Unable to elicit    Oral/Motor/Sensory Function Overall Oral Motor/Sensory Function: Impaired at baseline   Ice Chips Ice chips: Not tested   Thin Liquid Thin Liquid: Within functional limits Presentation: Cup;Straw    Nectar Thick Nectar Thick Liquid: Not tested   Honey Thick Honey Thick Liquid: Not tested   Puree Puree: Impaired Oral Phase Impairments: Poor awareness of bolus   Solid   GO    Solid: Impaired Oral Phase Impairments: Reduced lingual movement/coordination;Poor awareness of bolus;Impaired mastication Oral Phase Functional Implications: Oral residue;Oral holding      [name], [name] 03/01/2014,7:00 PM

## 2014-03-01 NOTE — Evaluation (Signed)
Physical Therapy Evaluation Patient Details Name: [name] MRN: [id number] DOB: Jan 24, 1927 Today's Date: 03/01/2014   History of Present Illness  Pt presents from facility with AMS and hypernatremia as well as poor intake. She has h/o CVA and dementia. Per chart, she is non-ambulatory and confused at baseline but recently has been very lethargic, minimally verbal, and unable to sit in her wheelchair.  Clinical Impression  Pt admitted with generalized weakness and impaired cognition. Pt currently with functional limitations due to the deficits listed below (see PT Problem List). Pt transferred bed to chair with +2 assist, mod level.  Pt will benefit from skilled PT to increase their independence and safety with mobility to allow discharge to the venue listed below. PT will continue to follow.       Follow Up Recommendations SNF    Equipment Recommendations  None recommended by PT    Recommendations for Other Services       Precautions / Restrictions Precautions Precautions: Fall Restrictions Weight Bearing Restrictions: No      Mobility  Bed Mobility Overal bed mobility: Needs Assistance;+2 for physical assistance Bed Mobility: Supine to Sit     Supine to sit: +2 for physical assistance;Mod assist     General bed mobility comments: hand over hand cues to get pt to grab bed rail and mod A to get pt's legs off bed while +2 assisted pt with trunk from bed to upright  Transfers Overall transfer level: Needs assistance Equipment used: 2 person hand held assist Transfers: Sit to/from [facility] Sit to Stand: +2 physical assistance;Mod assist Stand pivot transfers: +2 physical assistance;Mod assist       General transfer comment: pt would not initiate transfer with tactile cues and in fact would resist but when verbally cued, "ok, let's get in chair", would initiate transfer and needed mod A for power up as well as wt-shifting toward chair. Pt's feet  blocked for sit to stand but was able to step them with pivot. No knee buckling but pt very fatigued with transfer.  Ambulation/Gait             General Gait Details: pt non-ambulatory before admit per chart and very fatigued with just transfer  Stairs            Wheelchair Mobility    Modified Rankin (Stroke Patients Only)       Balance Overall balance assessment: Needs assistance Sitting-balance support: Bilateral upper extremity supported;Feet supported Sitting balance-Leahy Scale: Poor Sitting balance - Comments: at first pt needed min A to maintain sitting but then close supervision. Could maintain upright for 1-2 minutes but then lean bkwd and needed minA to prevent posterior lie onto bed Postural control: Posterior lean Standing balance support: Bilateral upper extremity supported;During functional activity Standing balance-Leahy Scale: Zero Standing balance comment: +2 arm in arm to maintain standing                             Pertinent Vitals/Pain No c/o pain    Home Living Family/patient expects to be discharged to:: Skilled nursing facility                      Prior Function Level of Independence: Needs assistance   Gait / Transfers Assistance Needed: per chart, pt was non ambulatory but had been getting in her w/c until recently           Hand Dominance  Extremity/Trunk Assessment   Upper Extremity Assessment: Difficult to assess due to impaired cognition           Lower Extremity Assessment: Difficult to assess due to impaired cognition;Generalized weakness      Cervical / Trunk Assessment: Kyphotic  Communication   Communication: Expressive difficulties  Cognition Arousal/Alertness: Awake/alert Behavior During Therapy: Agitated Overall Cognitive Status: No family/caregiver present to determine baseline cognitive functioning Area of Impairment: Problem solving;Following commands;Memory;Attention    Current Attention Level: Focused Memory: Decreased short-term memory Following Commands: Follows one step commands inconsistently     Problem Solving: Decreased initiation;Difficulty sequencing;Requires verbal cues;Requires tactile cues General Comments: pt calling on "Jesus" and "mama"  and "help me" repeatedly throughout session but did not seem to be in pain and did not necessarily seem distressed when she did this but more like it was a habitual response    General Comments      Exercises        Assessment/Plan    PT Assessment Patient needs continued PT services  PT Diagnosis Generalized weakness   PT Problem List Decreased strength;Decreased activity tolerance;Decreased balance;Decreased mobility;Decreased cognition;Decreased knowledge of precautions  PT Treatment Interventions DME instruction;Functional mobility training;Therapeutic activities;Therapeutic exercise;Balance training;Patient/family education   PT Goals (Current goals can be found in the Care Plan section) Acute Rehab PT Goals Patient Stated Goal: none stated PT Goal Formulation: Patient unable to participate in goal setting Time For Goal Achievement: 03/15/14 Potential to Achieve Goals: Fair    Frequency Min 2X/week   Barriers to discharge        Co-evaluation               End of Session Equipment Utilized During Treatment: Gait belt Activity Tolerance: Patient limited by fatigue Patient left: in chair;with call bell/phone within reach Nurse Communication: Mobility status;Other (comment) (pt needs assist with meal)         Time: 4163-8453 PT Time Calculation (min): 16 min   Charges:   PT Evaluation $Initial PT Evaluation Tier I: 1 Procedure PT Treatments $Therapeutic Activity: 8-22 mins   PT G Codes:        Leighton Roach, PT  Acute Rehab Services  [place], [place] 03/01/2014, 2:38 PM

## 2014-03-01 NOTE — Progress Notes (Addendum)
[facility] PROGRESS NOTE  [name] YKD:[id number] DOB: 1927-01-05 DOA: 02/28/2014 PCP: Ezequiel Kayser, MD  Assessment/Plan  Hypernatremia, stable, partly rehydrated but still hypernatremic -  D/c normal saline -  Start 1/2 NS and repeat BMP this afternoon  - hold lasix,  AKI on CKD. Likely due to dehydration, creatinine trended down from 2.6-1.5 with IV fluids -  Continue IV fluids - Repeat BMP in a.m. -  Continue to hold ACE inhibitor and Lasix -  Minimize nephrotoxins and renally dose medications   Elevated Troponin POC, Repeat troponin negative   A-fib, currently rate controlled, although listed as a new diagnosis, probably chronic since she is on digoxin normally without a diagnosis of systolic heart failure.  Likely high falls risk given her dementia -  Continue baby aspirin -  Continue digoxin, clonidine patch, metoprolol   Grade 2 diastolic dysfuction -  Hold Lasix and continue IV fluids -  monitor for evidence of hypervolemia  Polycythemia, likely hemoconcentration  -  Repeat in a.m.   Thrombocytopenia, acute phase reactant  -Repeat CBC   UTI,  -  Continue ceftriaxone -  urine culture pending  Left otitis media and conjunctivitis -  Continue ceftriaxone  Alzheimer's dementia  -  Cont Aricept   HTN (hypertension)  - cont home meds except for Lasix and ACE I  -on clonidine, hydralazine and Metoprolol   Hypercholesterolemia, Stable, continue simvastatin  Type II or unspecified type diabetes mellitus with peripheral circulatory disorders, uncontrolled(250.72) , CBGs well-controlled -  hold Lantus 10 U  -  continue low dose  insulin with meals ("3-6" with meals per med rec)   Diet:  Dysphagia 3 diet with thin liquids Access:  PIV  IVF:  Yes  Proph:  Heparin  Code Status: Full code, discussed with two daughters, one of whom is the health care power of attorney Family Communication: Patient and her daughter  Disposition Plan: Pending  improvement in mentation, sodium   Consultants:  None  Procedures:  CT head  Chest x-ray  Antibiotics:  Ceftriaxone from 6/19   HPI/Subjective:  More alert, states her left arm hurts (chronic problem per daughter)   Objective: Filed Vitals:   02/28/14 1122 02/28/14 1926 02/28/14 2100 03/01/14 0500  BP:  150/90 150/90 143/88  Pulse:   85 92  Temp: 94.5 F (34.7 C) 98.8 F (37.1 C) 97.6 F (36.4 C) 97.8 F (36.6 C)  TempSrc: Axillary Rectal    Resp:   16 16  Weight: 66.316 kg (146 lb 3.2 oz)   66.86 kg (147 lb 6.4 oz)  SpO2:   98% 97%    Intake/Output Summary (Last 24 hours) at 03/01/14 1016 Last data filed at 03/01/14 0900  Gross per 24 hour  Intake   1600 ml  Output      0 ml  Net   1600 ml   Filed Weights   02/28/14 1122 03/01/14 0500  Weight: 66.316 kg (146 lb 3.2 oz) 66.86 kg (147 lb 6.4 oz)    Exam:   General:  BF, No acute distress  HEENT:  NCAT, MMM  Cardiovascular:  RRR, nl S1, S2 no mrg, 2+ pulses, warm extremities  Respiratory:  CTAB, no increased WOB  Abdomen:   NABS, soft, NT/ND  MSK:   Normal tone and bulk, no LEE  Neuro:  CN II-XII grossly intact, grossly moves all extremities  Data Reviewed: Basic Metabolic Panel:  Recent Labs Lab 02/28/14 0700 02/28/14 0701 03/01/14 0425  NA 162* 162* 162*  K 5.1 4.7 3.6*  CL 120* 121* 121*  CO2 25  --  24  GLUCOSE 142* 139* 81  BUN 73* 77* 48*  CREATININE 2.32* 2.60* 1.51*  CALCIUM 10.1  --  9.3   Liver Function Tests:  Recent Labs Lab 02/28/14 0700 03/01/14 0425  AST 30 24  ALT 11 11  ALKPHOS 72 71  BILITOT 0.6 0.4  PROT 7.2 6.6  ALBUMIN 3.3* 3.0*   No results found for this basename: LIPASE, AMYLASE,  in the last 168 hours No results found for this basename: AMMONIA,  in the last 168 hours CBC:  Recent Labs Lab 02/28/14 0700 02/28/14 0701  WBC 10.4  --   NEUTROABS 5.7  --   HGB 16.0* 17.3*  HCT 49.5* 51.0*  MCV 92.7  --   PLT 112*  --    Cardiac  Enzymes:  Recent Labs Lab 02/28/14 0948  TROPONINI <0.30   BNP (last 3 results) No results found for this basename: PROBNP,  in the last 8760 hours CBG:  Recent Labs Lab 02/28/14 1119 02/28/14 1630 02/28/14 2122 03/01/14 0741 03/01/14 0843  GLUCAP 129* 140* 130* 45* 106*    No results found for this or any previous visit (from the past 240 hour(s)).   Studies: Ct Head Wo Contrast  02/28/2014   CLINICAL DATA:  Altered mental status  EXAM: CT HEAD WITHOUT CONTRAST  TECHNIQUE: Contiguous axial images were obtained from the base of the skull through the vertex without intravenous contrast.  COMPARISON:  Jan 21, 2013 head CT and Jan 22, 2013 brain MRI  FINDINGS: There is moderate diffuse atrophy there is no appreciable mass, hemorrhage, extra-axial fluid collection, or midline shift. There has been evolution of the infarct in the posterior left frontal lobe seen on prior examinations. Decreased attenuation is noted throughout this area currently. Currently there is widespread periventricular small vessel disease throughout the centra semiovale bilaterally. There are small prior lacunar infarcts in both external capsules as well as in the left and right alignment regions. The small posterior cerebellar infarct on the right seen as an acute finding on the previous study is not well seen on this current examination. There is some mild small vessel disease in the dentate nuclei of these cerebellar hemisphere.  No acute infarct is appreciable on this study.  The bony calvarium appears intact. The mastoid air cells are clear. There is diffuse opacification of left-sided mastoid air cells. There is opacification of the left middle ear, a new finding as well.  IMPRESSION: Atrophy with extensive small vessel disease and prior infarcts. There is no intracranial mass, hemorrhage, or acute appearing infarct currently. There is extensive left-sided mastoid air cell disease. There is also opacification in the  left middle ear, a new finding.   Electronically Signed   By: Lowella Grip M.D.   On: 02/28/2014 07:56   Dg Chest Portable 1 View  02/28/2014   CLINICAL DATA:  Fatigue.  Altered mental status.  EXAM: PORTABLE CHEST - 1 VIEW  COMPARISON:  12/06/2010  FINDINGS: Chronic elevation of right hemidiaphragm noted. Mild cardiomegaly is stable. Both lungs are clear.  IMPRESSION: Stable mild cardiomegaly and elevation of right hemidiaphragm. No acute findings.   Electronically Signed   By: Earle Gell M.D.   On: 02/28/2014 07:12    Scheduled Meds: . cloNIDine  0.3 mg Transdermal Q Fri-1800  . digoxin  0.125 mg Oral Daily  . divalproex  250 mg Oral BID  . donepezil  10 mg Oral QHS  . heparin  5,000 Units Subcutaneous 3 times per day  . hydrALAZINE  15 mg Oral TID  . insulin aspart  0-9 Units Subcutaneous TID WC  . insulin glargine  10 Units Subcutaneous QHS  . metoprolol succinate  100 mg Oral Daily  . multivitamin with minerals  1 tablet Oral Daily  . pantoprazole sodium  40 mg Oral Daily  . polyvinyl alcohol  1 drop Both Eyes QID  . potassium chloride  10 mEq Intravenous Q1 Hr x 4  . simvastatin  40 mg Oral q1800   Continuous Infusions: . sodium chloride 100 mL/hr at 03/01/14 9150    Principal Problem:   Hypernatremia Active Problems:   CVA (cerebral vascular accident)   Alzheimer's dementia   CKD (chronic kidney disease)   HTN (hypertension)   Hypercholesterolemia   Type II or unspecified type diabetes mellitus with peripheral circulatory disorders, uncontrolled(250.72)   AKI (acute kidney injury)    Time spent: 30 min    SHORT, [place] [facility] Pager [phone]. If 7PM-7AM, please contact night-coverage at [email or web address], password [facility] 03/01/2014, 10:16 AM  LOS: 1 day

## 2014-03-01 NOTE — Progress Notes (Signed)
Kathline Magic NP notified that Lab has attempted  4 times unsuccessfully (including foot stick per order )to obtain specimen for BMP. Respiratory Therapy unable to do Arterial Stick for labs. Jessie Foot, RN

## 2014-03-01 NOTE — Progress Notes (Signed)
Hypoglycemic Event  CBG: 45  Treatment: D50 IV 25 mL   Symptoms: None    Follow-up CBG: Time: 0843 CBG Result:106  Possible Reasons for Event: Inadequate meal intake   Comments/MD notified: yes    TEPE, AYABA [place]  Remember to initiate Hypoglycemia Order Set & complete

## 2014-03-02 LAB — GLUCOSE, CAPILLARY
GLUCOSE-CAPILLARY: 62 mg/dL — AB (ref 70–99)
GLUCOSE-CAPILLARY: 95 mg/dL (ref 70–99)
Glucose-Capillary: 75 mg/dL (ref 70–99)
Glucose-Capillary: 112 mg/dL — ABNORMAL HIGH (ref 70–99)
Glucose-Capillary: 130 mg/dL — ABNORMAL HIGH (ref 70–99)
Glucose-Capillary: 137 mg/dL — ABNORMAL HIGH (ref 70–99)

## 2014-03-02 LAB — URINE CULTURE

## 2014-03-02 LAB — BASIC METABOLIC PANEL WITH GFR
BUN: 25 mg/dL — ABNORMAL HIGH (ref 6–23)
CO2: 24 meq/L (ref 19–32)
Calcium: 8.6 mg/dL (ref 8.4–10.5)
Chloride: 116 meq/L — ABNORMAL HIGH (ref 96–112)
Creatinine, Ser: 1.18 mg/dL — ABNORMAL HIGH (ref 0.50–1.10)
GFR calc Af Amer: 47 mL/min — ABNORMAL LOW
GFR calc non Af Amer: 41 mL/min — ABNORMAL LOW
Glucose, Bld: 78 mg/dL (ref 70–99)
Potassium: 3.6 meq/L — ABNORMAL LOW (ref 3.7–5.3)
Sodium: 153 meq/L — ABNORMAL HIGH (ref 137–147)

## 2014-03-02 LAB — CBC
HCT: 38.2 % (ref 36.0–46.0)
Hemoglobin: 12.4 g/dL (ref 12.0–15.0)
MCH: 29.7 pg (ref 26.0–34.0)
MCHC: 32.5 g/dL (ref 30.0–36.0)
MCV: 91.6 fL (ref 78.0–100.0)
Platelets: 86 K/uL — ABNORMAL LOW (ref 150–400)
RBC: 4.17 MIL/uL (ref 3.87–5.11)
RDW: 17.2 % — ABNORMAL HIGH (ref 11.5–15.5)
WBC: 9.6 K/uL (ref 4.0–10.5)

## 2014-03-02 MED ORDER — POTASSIUM CHLORIDE CRYS ER 20 MEQ PO TBCR
40.0000 meq | EXTENDED_RELEASE_TABLET | Freq: Once | ORAL | Status: DC
  Filled 2014-03-02: qty 2

## 2014-03-02 MED ORDER — LORAZEPAM 0.5 MG PO TABS: 0.5000 mg | ORAL_TABLET | Freq: Three times a day (TID) | ORAL | Status: DC

## 2014-03-02 MED ORDER — POTASSIUM CL IN DEXTROSE 5% 20 MEQ/L IV SOLN
20.0000 meq | INTRAVENOUS | Status: DC
  Administered 2014-03-02: 20 meq via INTRAVENOUS
  Filled 2014-03-02 (×2): qty 1000

## 2014-03-02 MED ORDER — POTASSIUM CHLORIDE 20 MEQ/15ML (10%) PO LIQD
20.0000 meq | Freq: Once | ORAL | Status: AC
  Administered 2014-03-02: 20 meq via ORAL
  Filled 2014-03-02: qty 15

## 2014-03-02 MED ORDER — ASPIRIN EC 325 MG PO TBEC
325.0000 mg | DELAYED_RELEASE_TABLET | Freq: Every day | ORAL | Status: DC
  Administered 2014-03-02 – 2014-03-05 (×4): 325 mg via ORAL
  Filled 2014-03-02 (×4): qty 1

## 2014-03-02 MED ORDER — LORAZEPAM 0.5 MG PO TABS: 0.5000 mg | ORAL_TABLET | Freq: Three times a day (TID) | ORAL | Status: DC | PRN

## 2014-03-02 MED ORDER — FUROSEMIDE 40 MG PO TABS
40.0000 mg | ORAL_TABLET | Freq: Every day | ORAL | Status: DC
  Administered 2014-03-03: 40 mg via ORAL
  Filled 2014-03-02: qty 1

## 2014-03-02 NOTE — Progress Notes (Addendum)
[facility] PROGRESS NOTE  CARREEN MILIUS TMH:[id number] DOB: 07-01-1927 DOA: 02/28/2014 PCP: Ezequiel Kayser, MD  Assessment/Plan  Hypernatremia, stable, partly rehydrated but still hypernatremic -  D/c 1/2 NS and start D5W with potassium - hold lasix again today, appears euvolemic  AKI on CKD. Likely due to dehydration, creatinine trending down from 2.6 with IV fluids -  Continue IV fluids - Repeat BMP in a.m. -  Continue to hold ACE inhibitor and Lasix -  Minimize nephrotoxins and renally dose medications   Elevated Troponin POC, Repeat troponin negative   A-fib, currently rate controlled, although listed as a new diagnosis, probably chronic since she is on digoxin normally without a diagnosis of systolic heart failure.  Likely high falls risk given her dementia -  Continue baby aspirin -  Continue digoxin, clonidine patch, metoprolol  -  Okay to d/c telemetry:  Rate controlled a-fib with considerably less ectopy  Grade 2 diastolic dysfuction, appears euvolemic -  Hold Lasix and continue IV fluids -  monitor for evidence of hypervolemia  Polycythemia, likely hemoconcentration and resolved with IVF  Thrombocytopenia, acute phase reactant and continuing to trend down - check INR - stop heparin  E. Coli UTI -  Continue ceftriaxone -  sensitivities pending  Left otitis media and conjunctivitis -  Continue ceftriaxone  Alzheimer's dementia  -  Cont Aricept   HTN (hypertension)  - cont home meds except for Lasix and ACE I  -on clonidine, hydralazine and Metoprolol   Hypercholesterolemia, Stable, continue simvastatin  Type II or unspecified type diabetes mellitus with peripheral circulatory disorders, uncontrolled(250.72) , intermittent hypoglycemia -  continue low dose insulin with meals ("3-6" with meals per med rec)  -  Starting D5W   Diet:  Dysphagia 3 diet with thin liquids Access:  PIV  IVF:  Yes  Proph:  Heparin  Code Status: Full code, discussed  with two daughters, one of whom is the health care power of attorney Family Communication: Patient and her daughter  Disposition Plan: Pending improvement in mentation, sodium   Consultants:  None  Procedures:  CT head  Chest x-ray  Antibiotics:  Ceftriaxone from 6/19   HPI/Subjective:  Alert, denies pain, SOB, nausea  Objective: Filed Vitals:   03/01/14 1407 03/01/14 2000 03/02/14 0500 03/02/14 0957  BP: 111/86 140/70 129/83 121/79  Pulse: 109 62 65 74  Temp: 97.7 F (36.5 C) 98.5 F (36.9 C) 98.6 F (37 C)   TempSrc: Axillary     Resp: 18 16 16    Weight:   67.132 kg (148 lb)   SpO2: 93% 100% 99%     Intake/Output Summary (Last 24 hours) at 03/02/14 1044 Last data filed at 03/01/14 1700  Gross per 24 hour  Intake    260 ml  Output      0 ml  Net    260 ml   Filed Weights   02/28/14 1122 03/01/14 0500 03/02/14 0500  Weight: 66.316 kg (146 lb 3.2 oz) 66.86 kg (147 lb 6.4 oz) 67.132 kg (148 lb)    Exam:   General:  BF, No acute distress  HEENT:  NCAT, MMM  Cardiovascular:  RRR, nl S1, S2 no mrg, 2+ pulses, warm extremities  Respiratory:  CTAB, no increased WOB  Abdomen:   NABS, soft, mild TTP diffusely in abdomen, no rebound or guarding.  MSK:   Normal tone and bulk, no LEE  Neuro:  CN II-XII grossly intact, grossly moves all extremities  Data Reviewed: Basic Metabolic Panel:  Recent Labs Lab 02/28/14 0700 02/28/14 0701 03/01/14 0425 03/02/14 0502  NA 162* 162* 162* 153*  K 5.1 4.7 3.6* 3.6*  CL 120* 121* 121* 116*  CO2 25  --  24 24  GLUCOSE 142* 139* 81 78  BUN 73* 77* 48* 25*  CREATININE 2.32* 2.60* 1.51* 1.18*  CALCIUM 10.1  --  9.3 8.6   Liver Function Tests:  Recent Labs Lab 02/28/14 0700 03/01/14 0425  AST 30 24  ALT 11 11  ALKPHOS 72 71  BILITOT 0.6 0.4  PROT 7.2 6.6  ALBUMIN 3.3* 3.0*   No results found for this basename: LIPASE, AMYLASE,  in the last 168 hours No results found for this basename: AMMONIA,  in  the last 168 hours CBC:  Recent Labs Lab 02/28/14 0700 02/28/14 0701 03/02/14 0502  WBC 10.4  --  9.6  NEUTROABS 5.7  --   --   HGB 16.0* 17.3* 12.4  HCT 49.5* 51.0* 38.2  MCV 92.7  --  91.6  PLT 112*  --  86*   Cardiac Enzymes:  Recent Labs Lab 02/28/14 0948  TROPONINI <0.30   BNP (last 3 results) No results found for this basename: PROBNP,  in the last 8760 hours CBG:  Recent Labs Lab 03/01/14 1627 03/01/14 1949 03/02/14 0424 03/02/14 0752 03/02/14 0835  GLUCAP 127* 136* 75 62* 112*    Recent Results (from the past 240 hour(s))  CULTURE, BLOOD (ROUTINE X 2)     Status: None   Collection Time    02/28/14  6:55 AM      Result Value Ref Range Status   Specimen Description BLOOD RIGHT FOREARM   Final   Special Requests BOTTLES DRAWN AEROBIC AND ANAEROBIC 6CCS   Final   Culture  Setup Time     Final   Value: 02/28/2014 12:26     Performed at [facility]   Culture     Final   Value:        BLOOD CULTURE RECEIVED NO GROWTH TO DATE CULTURE WILL BE HELD FOR 5 DAYS BEFORE ISSUING A FINAL NEGATIVE REPORT     Performed at [facility]   Report Status PENDING   Incomplete  CULTURE, BLOOD (ROUTINE X 2)     Status: None   Collection Time    02/28/14  7:30 AM      Result Value Ref Range Status   Specimen Description BLOOD LEFT ANTECUBITAL   Final   Special Requests BOTTLES DRAWN AEROBIC AND ANAEROBIC 5CCS   Final   Culture  Setup Time     Final   Value: 02/28/2014 12:26     Performed at [facility]   Culture     Final   Value:        BLOOD CULTURE RECEIVED NO GROWTH TO DATE CULTURE WILL BE HELD FOR 5 DAYS BEFORE ISSUING A FINAL NEGATIVE REPORT     Performed at [facility]   Report Status PENDING   Incomplete  URINE CULTURE     Status: None   Collection Time    02/28/14  8:10 AM      Result Value Ref Range Status   Specimen Description URINE, CATHETERIZED   Final   Special Requests NONE   Final   Culture  Setup Time     Final    Value: 02/28/2014 12:53     Performed at [place]     Final  Value: >=100,000 COLONIES/ML     Performed at [facility]   Culture     Final   Value: ESCHERICHIA COLI     Performed at [facility]   Report Status PENDING   Incomplete     Studies: No results found.  Scheduled Meds: . aspirin EC  325 mg Oral Daily  . cloNIDine  0.3 mg Transdermal Q Fri-1800  . digoxin  0.125 mg Oral Daily  . divalproex  250 mg Oral BID  . donepezil  10 mg Oral QHS  . heparin  5,000 Units Subcutaneous 3 times per day  . hydrALAZINE  15 mg Oral TID  . insulin aspart  0-6 Units Subcutaneous TID WC  . metoprolol succinate  100 mg Oral Daily  . multivitamin with minerals  1 tablet Oral Daily  . pantoprazole sodium  40 mg Oral Daily  . polyvinyl alcohol  1 drop Both Eyes QID  . potassium chloride  20 mEq Oral Once  . simvastatin  40 mg Oral q1800   Continuous Infusions: . sodium chloride 100 mL/hr at 03/01/14 9390    Principal Problem:   Hypernatremia Active Problems:   CVA (cerebral vascular accident)   Alzheimer's dementia   CKD (chronic kidney disease)   HTN (hypertension)   Hypercholesterolemia   Type II or unspecified type diabetes mellitus with peripheral circulatory disorders, uncontrolled(250.72)   AKI (acute kidney injury)    Time spent: 30 min    SHORT, [place] [facility] Pager [phone]. If 7PM-7AM, please contact night-coverage at [email or web address], password [facility] 03/02/2014, 10:44 AM  LOS: 2 days

## 2014-03-03 LAB — BASIC METABOLIC PANEL
BUN: 16 mg/dL (ref 6–23)
CO2: 24 meq/L (ref 19–32)
Calcium: 8.5 mg/dL (ref 8.4–10.5)
Chloride: 114 mEq/L — ABNORMAL HIGH (ref 96–112)
Creatinine, Ser: 1.06 mg/dL (ref 0.50–1.10)
GFR calc Af Amer: 54 mL/min — ABNORMAL LOW (ref >90)
GFR, EST NON AFRICAN AMERICAN: 47 mL/min — AB (ref >90)
Glucose, Bld: 133 mg/dL — ABNORMAL HIGH (ref 70–99)
POTASSIUM: 3.7 meq/L (ref 3.7–5.3)
Sodium: 148 mEq/L — ABNORMAL HIGH (ref 137–147)

## 2014-03-03 LAB — CBC
HCT: 35.9 % — ABNORMAL LOW (ref 36.0–46.0)
HEMOGLOBIN: 11.6 g/dL — AB (ref 12.0–15.0)
MCH: 29.1 pg (ref 26.0–34.0)
MCHC: 32.3 g/dL (ref 30.0–36.0)
MCV: 90 fL (ref 78.0–100.0)
Platelets: 91 10*3/uL — ABNORMAL LOW (ref 150–400)
RBC: 3.99 MIL/uL (ref 3.87–5.11)
RDW: 16.7 % — ABNORMAL HIGH (ref 11.5–15.5)
WBC: 8 10*3/uL (ref 4.0–10.5)

## 2014-03-03 LAB — PROTIME-INR
INR: 1.02 (ref 0.00–1.49)
PROTHROMBIN TIME: 13.2 s (ref 11.6–15.2)

## 2014-03-03 LAB — GLUCOSE, CAPILLARY
Glucose-Capillary: 127 mg/dL — ABNORMAL HIGH (ref 70–99)
Glucose-Capillary: 120 mg/dL — ABNORMAL HIGH (ref 70–99)
Glucose-Capillary: 148 mg/dL — ABNORMAL HIGH (ref 70–99)
Glucose-Capillary: 157 mg/dL — ABNORMAL HIGH (ref 70–99)

## 2014-03-03 LAB — HEMOGLOBIN A1C
HEMOGLOBIN A1C: 7 % — AB (ref <5.7)
Mean Plasma Glucose: 154 mg/dL — ABNORMAL HIGH (ref <117)

## 2014-03-03 MED ORDER — DEXTROSE 5 % IV SOLN
1.0000 g | INTRAVENOUS | Status: DC
  Administered 2014-03-03 – 2014-03-05 (×3): 1 g via INTRAVENOUS
  Filled 2014-03-03 (×4): qty 10

## 2014-03-03 MED ORDER — POTASSIUM CL IN DEXTROSE 5% 20 MEQ/L IV SOLN
20.0000 meq | INTRAVENOUS | Status: DC
  Administered 2014-03-03 – 2014-03-05 (×2): 20 meq via INTRAVENOUS
  Filled 2014-03-03 (×7): qty 1000

## 2014-03-03 MED ORDER — METOPROLOL TARTRATE 50 MG PO TABS
50.0000 mg | ORAL_TABLET | Freq: Two times a day (BID) | ORAL | Status: DC
  Administered 2014-03-03 – 2014-03-05 (×5): 50 mg via ORAL
  Filled 2014-03-03 (×8): qty 1

## 2014-03-03 MED ORDER — FUROSEMIDE 20 MG PO TABS
20.0000 mg | ORAL_TABLET | Freq: Every day | ORAL | Status: DC
  Administered 2014-03-04 – 2014-03-05 (×2): 20 mg via ORAL
  Filled 2014-03-03 (×4): qty 1

## 2014-03-03 MED ORDER — FUROSEMIDE 8 MG/ML PO SOLN: 20.0000 mg | Freq: Every day | ORAL | Status: DC

## 2014-03-03 NOTE — Progress Notes (Signed)
CARE MANAGEMENT NOTE 03/03/2014  Patient:  [name], [name]   Account Number:  [id number]  Date Initiated:  02/28/2014  Documentation initiated by:  [facility]  Subjective/Objective Assessment:   hypernatremia     Action/Plan:   from [facility] SNF   Anticipated DC Date:  03/03/2014   Anticipated DC Plan:  [place]  In-house referral  Clinical Social Worker      DC Planning Services  CM consult      Choice offered to / List presented to:             Status of service:  Completed, signed off Medicare Important Message given?  YES (If response is "NO", the following Medicare IM given date fields will be blank) Date Medicare IM given:  03/03/2014 Date Additional Medicare IM given:    Discharge Disposition:  [place]  Per UR Regulation:  Reviewed for med. necessity/level of care/duration of stay  If discussed at [place] of Stay Meetings, dates discussed:    Comments:  03/03/2014 1445 NCM spoke to pt (dsyphagia) and dtr, Erick Blinks # [phone] was able to speak for pt. Dtr states she is agreeable to pt going back to SNF, but her long term goal is have pt come live with her in her home. She has designed the home to be handicap accessible. Requested information on PACE program. Provided information to dtr on PACE program. Jonnie Finner RN CCM Case Mgmt phone [phone]  03-03-14 [address], [place] BSN [phone] Upon d/c pt will be d/c to SNF. CSW assisting with disposition needs.   02/28/2014 3:47 PM  UR completed. Jonnie Finner RN CCM Case Mgmt phone [phone]

## 2014-03-03 NOTE — Progress Notes (Signed)
Clinical Social Work Department BRIEF PSYCHOSOCIAL ASSESSMENT 03/03/2014  Patient:  [name], [name]     Account Number:  [id number]     Admit date:  02/28/2014  Clinical Social Worker:  Adair Laundry  Date/Time:  03/03/2014 09:45 AM  Referred by:  RN  Date Referred:  03/03/2014 Referred for  SNF Placement   Other Referral:   Interview type:  Family Other interview type:   Spoke with pt daughter Jeanann Lewandowsky over the phone    PSYCHOSOCIAL DATA Living Status:  FACILITY Admitted from facility:  [facility] Level of care:  [place] Primary support name:  Mal Misty [phone] Primary support relationship to patient:  CHILD, ADULT Degree of support available:   Pt has good family support    CURRENT CONCERNS Current Concerns  Post-Acute Placement   Other Concerns:    SOCIAL WORK ASSESSMENT / PLAN CSW made aware that pt was admitted from facility. CSW also made aware that one of pt's children are her HCPOA. CSW called pt children and was able to speak with daughter Jeanann Lewandowsky. She confirmed that pt was admitted from [facility] and plan wil be to return. She informed CSW she does have concerns about facility and will work on changing pt living situation after discharge. CSW confirmed that pt daughter Peter Congo was HCPOA. CSW called Peter Congo twice and left voicemail asking for return phone call to confirm plan will be for pt to return to [facility] when medically stable.   Assessment/plan status:  Psychosocial Support/Ongoing Assessment of Needs Other assessment/ plan:   Information/referral to community resources:   None needed at this time    PATIENT'S/FAMILY'S RESPONSE TO PLAN OF CARE: Pt daughter Jeanann Lewandowsky agreeable for dc back to facility. Awaiting return call from pt [facility], [place]

## 2014-03-03 NOTE — Progress Notes (Signed)
[facility] PROGRESS NOTE  [name] ZDG:[id number] DOB: 1926-11-08 DOA: 02/28/2014 PCP: Ezequiel Kayser, MD  Assessment/Plan  Hypernatremia, stable, partly rehydrated but still hypernatremic -  Continue d5W  AKI on CKD. Likely due to dehydration and resolved with IVF -  Daily BMP -  Continue to hold ACE inhibitor  -  Minimize nephrotoxins and renally dose medications   Elevated Troponin POC, Repeat troponin negative   A-fib, currently rate controlled. Likely high falls risk given her dementia -  Continue aspirin -  Continue clonidine patch -  Change metoprolol to crushable version -  D/c digoxin -  Continue clonidine patch  Grade 2 diastolic dysfuction, at risk for hypervolemia due to persistent IVF -  Start low dose lasix  Polycythemia, likely hemoconcentration and resolved with IVF  Thrombocytopenia, acute phase reactant and resolving - INR wnl  E. Coli UTI, pansensitive -  Restart ceftriaxone (had fallen off MR since 6/19)  Left otitis media and conjunctivitis -  Continue ceftriaxone  Alzheimer's dementia  -  Will discuss Aricept with family, may be able to stop this medication also  HTN (hypertension), try to minimize medications.  Goal BP given age and comorbities around SBP 150. - continue to hold ACEI for now.  Consider restarting in 2 weeks -  Continue clonidine -  d/c hydralazine  -  Continue Metoprolol   Hypercholesterolemia, Stable, family okay with stopping simvastatin  Type II or unspecified type diabetes mellitus with peripheral circulatory disorders, uncontrolled(250.72) , intermittent hypoglycemia -  continue low dose insulin with meals ("3-6" with meals per med rec)  -  Continue D5W   Diet:  Dysphagia 3 diet with thin liquids Access:  PIV  IVF:  Yes  Proph:  sCDs  Code Status: Full code, discussed with two daughters.  Need to discuss goals of care again with family.   Family Communication: Patient and her daughter  Disposition  Plan: Pending improvement in sodium   Consultants:  None  Procedures:  CT head  Chest x-ray  Antibiotics:  Ceftriaxone from 6/19   HPI/Subjective:  Denies pain, SOB.  Demented.    Objective: Filed Vitals:   03/02/14 2100 03/03/14 0500 03/03/14 0900 03/03/14 1047  BP: 135/63 158/60  141/85  Pulse: 70 64  60  Temp: 98 F (36.7 C) 98 F (36.7 C)    TempSrc:      Resp: 16 16    Height:   5\' 4"  (1.626 m)   Weight:  66.679 kg (147 lb)    SpO2: 100% 100%      Intake/Output Summary (Last 24 hours) at 03/03/14 1107 Last data filed at 03/03/14 0912  Gross per 24 hour  Intake 1093.33 ml  Output      0 ml  Net 1093.33 ml   Filed Weights   03/01/14 0500 03/02/14 0500 03/03/14 0500  Weight: 66.86 kg (147 lb 6.4 oz) 67.132 kg (148 lb) 66.679 kg (147 lb)    Exam:   General:  BF, No acute distress  HEENT:  NCAT, MMM  Cardiovascular:  RRR, nl S1, S2 no mrg, 2+ pulses, warm extremities  Respiratory:  CTAB, no increased WOB  Abdomen:   NABS, soft, mild TTP diffusely in abdomen, no rebound or guarding.  MSK:   Normal tone and bulk, trace bilateral LEE  Neuro:  CN II-XII grossly intact, grossly moves all extremities  Data Reviewed: Basic Metabolic Panel:  Recent Labs Lab 02/28/14 0700 02/28/14 0701 03/01/14 0425 03/02/14 0502 03/03/14 0315  NA 162*  162* 162* 153* 148*  K 5.1 4.7 3.6* 3.6* 3.7  CL 120* 121* 121* 116* 114*  CO2 25  --  24 24 24   GLUCOSE 142* 139* 81 78 133*  BUN 73* 77* 48* 25* 16  CREATININE 2.32* 2.60* 1.51* 1.18* 1.06  CALCIUM 10.1  --  9.3 8.6 8.5   Liver Function Tests:  Recent Labs Lab 02/28/14 0700 03/01/14 0425  AST 30 24  ALT 11 11  ALKPHOS 72 71  BILITOT 0.6 0.4  PROT 7.2 6.6  ALBUMIN 3.3* 3.0*   No results found for this basename: LIPASE, AMYLASE,  in the last 168 hours No results found for this basename: AMMONIA,  in the last 168 hours CBC:  Recent Labs Lab 02/28/14 0700 02/28/14 0701 03/02/14 0502  03/03/14 0315  WBC 10.4  --  9.6 8.0  NEUTROABS 5.7  --   --   --   HGB 16.0* 17.3* 12.4 11.6*  HCT 49.5* 51.0* 38.2 35.9*  MCV 92.7  --  91.6 90.0  PLT 112*  --  86* 91*   Cardiac Enzymes:  Recent Labs Lab 02/28/14 0948  TROPONINI <0.30   BNP (last 3 results) No results found for this basename: PROBNP,  in the last 8760 hours CBG:  Recent Labs Lab 03/02/14 0835 03/02/14 1155 03/02/14 1641 03/02/14 2030 03/03/14 0724  GLUCAP 112* 95 130* 137* 127*    Recent Results (from the past 240 hour(s))  CULTURE, BLOOD (ROUTINE X 2)     Status: None   Collection Time    02/28/14  6:55 AM      Result Value Ref Range Status   Specimen Description BLOOD RIGHT FOREARM   Final   Special Requests BOTTLES DRAWN AEROBIC AND ANAEROBIC 6CCS   Final   Culture  Setup Time     Final   Value: 02/28/2014 12:26     Performed at [facility]   Culture     Final   Value:        BLOOD CULTURE RECEIVED NO GROWTH TO DATE CULTURE WILL BE HELD FOR 5 DAYS BEFORE ISSUING A FINAL NEGATIVE REPORT     Performed at [facility]   Report Status PENDING   Incomplete  CULTURE, BLOOD (ROUTINE X 2)     Status: None   Collection Time    02/28/14  7:30 AM      Result Value Ref Range Status   Specimen Description BLOOD LEFT ANTECUBITAL   Final   Special Requests BOTTLES DRAWN AEROBIC AND ANAEROBIC 5CCS   Final   Culture  Setup Time     Final   Value: 02/28/2014 12:26     Performed at [facility]   Culture     Final   Value:        BLOOD CULTURE RECEIVED NO GROWTH TO DATE CULTURE WILL BE HELD FOR 5 DAYS BEFORE ISSUING A FINAL NEGATIVE REPORT     Performed at [facility]   Report Status PENDING   Incomplete  URINE CULTURE     Status: None   Collection Time    02/28/14  8:10 AM      Result Value Ref Range Status   Specimen Description URINE, CATHETERIZED   Final   Special Requests NONE   Final   Culture  Setup Time     Final   Value: 02/28/2014 12:53     Performed at  [place]  Final   Value: >=100,000 COLONIES/ML     Performed at [facility]   Culture     Final   Value: ESCHERICHIA COLI     Performed at [facility]   Report Status 03/02/2014 FINAL   Final   Organism ID, Bacteria ESCHERICHIA COLI   Final     Studies: No results found.  Scheduled Meds: . aspirin EC  325 mg Oral Daily  . cefTRIAXone (ROCEPHIN)  IV  1 g Intravenous Q24H  . cloNIDine  0.3 mg Transdermal Q Fri-1800  . digoxin  0.125 mg Oral Daily  . divalproex  250 mg Oral BID  . donepezil  10 mg Oral QHS  . furosemide  40 mg Oral Daily  . hydrALAZINE  15 mg Oral TID  . insulin aspart  0-6 Units Subcutaneous TID WC  . metoprolol succinate  100 mg Oral Daily  . multivitamin with minerals  1 tablet Oral Daily  . pantoprazole sodium  40 mg Oral Daily  . polyvinyl alcohol  1 drop Both Eyes QID  . simvastatin  40 mg Oral q1800   Continuous Infusions: . dextrose 5 % with KCl 20 mEq / L 20 mEq (03/02/14 1346)    Principal Problem:   Hypernatremia Active Problems:   CVA (cerebral vascular accident)   Alzheimer's dementia   CKD (chronic kidney disease)   HTN (hypertension)   Hypercholesterolemia   Type II or unspecified type diabetes mellitus with peripheral circulatory disorders, uncontrolled(250.72)   AKI (acute kidney injury)    Time spent: 30 min    SHORT, [place] [facility] Pager [phone]. If 7PM-7AM, please contact night-coverage at [email or web address], password [facility] 03/03/2014, 11:07 AM  LOS: 3 days

## 2014-03-03 NOTE — Progress Notes (Signed)
CSW (Clinical Education officer, museum) spoke with pt daughter Peter Congo and confirmed pt is long term resident at [facility] and plan will be for pt to return at dc. Pt daughter did express that she has some concerns with facility however she did not want to elaborate any further and informed CSW there were no urgent issues. CSW provided with contact information and offered to try to help resolve issues. Pt daughter thankful but informed CSW this would not be necessary.  CSW spoke with Bethena Roys with facility admissions and confirmed pt can return when medically stable.  [place], [place]

## 2014-03-04 ENCOUNTER — Encounter (HOSPITAL_COMMUNITY): Payer: Self-pay | Admitting: General Practice

## 2014-03-04 DIAGNOSIS — I10 Essential (primary) hypertension: Secondary | ICD-10-CM

## 2014-03-04 LAB — CBC WITH DIFFERENTIAL/PLATELET
Basophils Absolute: 0 10*3/uL (ref 0.0–0.1)
Basophils Relative: 0 % (ref 0–1)
EOS ABS: 0.4 10*3/uL (ref 0.0–0.7)
Eosinophils Relative: 4 % (ref 0–5)
HEMATOCRIT: 37.4 % (ref 36.0–46.0)
HEMOGLOBIN: 12.2 g/dL (ref 12.0–15.0)
Lymphocytes Relative: 54 % — ABNORMAL HIGH (ref 12–46)
Lymphs Abs: 4.3 10*3/uL — ABNORMAL HIGH (ref 0.7–4.0)
MCH: 29.3 pg (ref 26.0–34.0)
MCHC: 32.6 g/dL (ref 30.0–36.0)
MCV: 89.9 fL (ref 78.0–100.0)
MONO ABS: 0.5 10*3/uL (ref 0.1–1.0)
MONOS PCT: 7 % (ref 3–12)
Neutro Abs: 2.8 10*3/uL (ref 1.7–7.7)
Neutrophils Relative %: 35 % — ABNORMAL LOW (ref 43–77)
Platelets: 96 10*3/uL — ABNORMAL LOW (ref 150–400)
RBC: 4.16 MIL/uL (ref 3.87–5.11)
RDW: 16.7 % — AB (ref 11.5–15.5)
WBC: 7.9 10*3/uL (ref 4.0–10.5)

## 2014-03-04 LAB — BASIC METABOLIC PANEL
BUN: 9 mg/dL (ref 6–23)
CHLORIDE: 111 meq/L (ref 96–112)
CO2: 23 meq/L (ref 19–32)
CREATININE: 1 mg/dL (ref 0.50–1.10)
Calcium: 8.6 mg/dL (ref 8.4–10.5)
GFR calc Af Amer: 58 mL/min — ABNORMAL LOW (ref >90)
GFR calc non Af Amer: 50 mL/min — ABNORMAL LOW (ref >90)
GLUCOSE: 146 mg/dL — AB (ref 70–99)
POTASSIUM: 4.2 meq/L (ref 3.7–5.3)
Sodium: 144 mEq/L (ref 137–147)

## 2014-03-04 LAB — GLUCOSE, CAPILLARY
GLUCOSE-CAPILLARY: 135 mg/dL — AB (ref 70–99)
GLUCOSE-CAPILLARY: 128 mg/dL — AB (ref 70–99)
GLUCOSE-CAPILLARY: 133 mg/dL — AB (ref 70–99)
Glucose-Capillary: 119 mg/dL — ABNORMAL HIGH (ref 70–99)

## 2014-03-04 MED ORDER — ENSURE COMPLETE PO LIQD: 237.0000 mL | Freq: Three times a day (TID) | ORAL | Status: DC

## 2014-03-04 MED ORDER — ENSURE COMPLETE PO LIQD: 237.0000 mL | Freq: Three times a day (TID) | ORAL | Status: AC

## 2014-03-04 MED ORDER — ASPIRIN 325 MG PO TBEC: 325.0000 mg | DELAYED_RELEASE_TABLET | Freq: Every day | ORAL | Status: DC

## 2014-03-04 MED ORDER — LORAZEPAM 0.5 MG PO TABS: 0.5000 mg | ORAL_TABLET | Freq: Three times a day (TID) | ORAL | Status: DC | PRN

## 2014-03-04 MED ORDER — TRAMADOL HCL 50 MG PO TABS: 50.0000 mg | ORAL_TABLET | Freq: Four times a day (QID) | ORAL | Status: DC | PRN

## 2014-03-04 MED ORDER — METOPROLOL TARTRATE 50 MG PO TABS: 50.0000 mg | ORAL_TABLET | Freq: Two times a day (BID) | ORAL | Status: DC

## 2014-03-04 MED ORDER — LIDOCAINE HCL 1 % IJ SOLN: 1000.0000 mg | INTRAMUSCULAR | Status: DC

## 2014-03-04 NOTE — Progress Notes (Signed)
Physical Therapy Treatment/ Discharge Patient Details Name: [name] MRN: [id number] DOB: 01/31/1927 Today's Date: 03/04/2014    History of Present Illness Pt presents from facility with AMS and hypernatremia as well as poor intake. She has h/o CVA and dementia. Per chart, she is non-ambulatory and confused at baseline but recently has been very lethargic, minimally verbal, and unable to sit in her wheelchair.    PT Comments    Pt at baseline is total assist and is unable to follow commands accurately or progress with mobility at this time. Pt from SNF to return to sNF and will defer all further therapy to next venue of care as pt not able to progress quickly in acute environment. Recommend lift equipment for OOB as ROM via nursing as pt will allow. Pt currently refusing any further activity with this therapist, NT present end of session with pt refusing to eat and spitting at tech.   Follow Up Recommendations  SNF     Equipment Recommendations       Recommendations for Other Services       Precautions / Restrictions Precautions Precautions: Fall Precaution Comments: non-ambulatory Restrictions Weight Bearing Restrictions: No    Mobility  Bed Mobility Overal bed mobility: Needs Assistance;+2 for physical assistance Bed Mobility: Rolling;Sidelying to Sit;Sit to Sidelying Rolling: Max assist Sidelying to sit: Max assist     Sit to sidelying: Max assist General bed mobility comments: max multimodal cues with hand over hand assist to grab rail, physical assist to bend knees and rotate pelvis. Assist to elevate trunk from surface and legs back to surface  Transfers                    Ambulation/Gait                 Stairs            Wheelchair Mobility    Modified Rankin (Stroke Patients Only)       Balance Overall balance assessment: Needs assistance   Sitting balance-Leahy Scale: Poor Sitting balance - Comments: pt with min assist for 4  min EOB with max cues for midline posture                            Cognition Arousal/Alertness: Awake/alert Behavior During Therapy: Agitated Overall Cognitive Status: No family/caregiver present to determine baseline cognitive functioning Area of Impairment: Orientation Orientation Level: Disoriented to;Person;Place;Time;Situation Current Attention Level: Focused Memory: Decreased short-term memory Following Commands: Follows one step commands inconsistently       General Comments: pt calling out "mama"    Exercises General Exercises - Lower Extremity Heel Slides: AAROM;Both;5 reps;Supine    General Comments        Pertinent Vitals/Pain PAINAD= 0    Home Living                      Prior Function            PT Goals (current goals can now be found in the care plan section) Progress towards PT goals: Not progressing toward goals - comment (pt not able to follow commands and will defer any further needs to SNF)    Frequency       PT Plan      Co-evaluation             End of Session   Activity Tolerance: Other (comment) (pt limited by cognition  and inability to accurately follow commands) Patient left: in bed;with bed alarm set;with nursing/sitter in room     Time: 1203-1218 PT Time Calculation (min): 15 min  Charges:  $Therapeutic Activity: 8-22 mins                    G Codes:      Melford Aase 2014-03-31, 12:25 PM Elwyn Reach, [place]

## 2014-03-04 NOTE — Discharge Summary (Addendum)
Physician Discharge Summary  [name] FIE:[id number] DOB: Jan 06, 1927 DOA: 02/28/2014  PCP: Ezequiel Kayser, MD  Admit date: 02/28/2014 Discharge date: 03/04/2014  Recommendations for Outpatient Follow-up:  1. Please continue to assist with meals, small frequent amounts throughout the day. Aspiration precautions please.  Advised family that I expect she will be rehospitalized soon because of dehydration or because of aspiration or other infection.   2. Continue ceftriaxone 1gm IM daily through 6/28, then stop.   3. F/u blood cultures which are pending at time of discharge  Discharge Diagnoses:  Principal Problem:   Hypernatremia Active Problems:   CVA (cerebral vascular accident)   Alzheimer's dementia   CKD (chronic kidney disease)   HTN (hypertension)   Hypercholesterolemia   Type II or unspecified type diabetes mellitus with peripheral circulatory disorders, uncontrolled(250.72)   AKI (acute kidney injury)   Discharge Condition: Stable, improved  Diet recommendation:   Dysphagia 2 (Fine chop);Thin liquid  Liquid Administration via: Cup;Straw  Medication Administration: Crushed with puree  Supervision: Staff to assist with self feeding;Full supervision/cueing for compensatory strategies  Compensations: Small sips/bites;Check for pocketing  Postural Changes and/or Swallow Maneuvers: Out of bed for meals;Seated upright 90 degrees;Upright 30-60 min after meal    Wt Readings from Last 3 Encounters:  03/04/14 66.9 kg (147 lb 7.8 oz)  02/10/14 73.483 kg (162 lb)  01/13/14 73.483 kg (162 lb)    History of present illness:  [name] is a 80 y.o. female with PMH as below who is unable to give me a history and no family is available. The history is obtained from the ER PA report and her chart. [er the ER PA who spoke with the son, apparently she has been confused and not eating well for weeks. I have called her son but he is not picking up the phone.  Pt thinks she in in  her 13s. She is not aware of where she is either. I am unable to obtain further history   Hospital Course:   Hypernatremia, most likely secondary to dehydration because of her advanced dementia and poor oral intake. She was also taking a diuretic. Her diuretics were discontinued, and she was initially rehydrated with normal saline. After she achieves near euvolemic, her IV fluids were changed to hypotonic IV fluids. Her sodium gradually trended down to 144. The family is aware that the patient has advanced dementia and does not like to eat or drink. She tends to do better with family members feeding her and administering her medications, so they are going to try to assist with this. They may also try to take her home at some point once arrangements have been made. She is at high risk for recurrent dehydration secondary to poor oral intake. The family is struggling with her overall prognosis, and at this time she is still full code. They were given information about advanced dementia so they can familiarized themselves again with what to expect over the next few months.  E. coli urinary tract infection, left otitis media, left conjunctivitis. She was started on ceftriaxone.  Initially had planned to transition to keflex, however, at the family's request because it is difficult to get the patient to take her medications, she was set up for once daily ceftriaxone injections.  The ability of the SNF to administer these injections was verified by SW prior to discharge.    Acute on chronic kidney disease due to dehydration. Her creatinine trended down from 2.32-1 with IV fluids.  Elevated point-of-care  troponins, likely secondary to dehydration and acute kidney injury. A repeat troponins were negative.  Atrial fibrillation, currently rate controlled. She is not on anticoagulation secondary to her dementia and high falls risk. She continued aspirin. Because of her inability to tolerate oral medications very  easily, she was continued on aspirin, clonidine patch. Her digoxin was discontinued. Her metoprolol was changed from extended release to a crushable short acting version. Her heart rate remained in the 60s and blood pressure remained stable with these changes.  Hypertension, because of her difficulty tolerating oral medications and because of her acute kidney injury, her ACE inhibitor was discontinued. Her goal systolic blood pressure should be near 150 given her age and comorbidities.  HLD, family okay with stopping statin.    Grade 2 diastolic dysfunction, currently euvolemic. Recommend stopping her daily Lasix because of her high risk for dehydration given her poor oral intake.  Polycythemia, due to dehydration and resolved with IV fluids.  Thrombocytopenia, acute phase reactant, nadired at 86 on 6/21. Her INR was within normal limits. Her platelet count is trending up and there is no evidence of active bleeding.  Alzheimer's dementia, severe, patient not tolerating medications or food oral water very easily. Discussed her poor prognosis with her family, however they still wish her to be full code and to be brought back to the hospital for intermittent IV fluids and antibiotics. They were given an additional handouts about advanced dementia so they can continue to think about her goals of care.  Donepezil continued.    Type 2 diabetes mellitus, peripheral circulatory disorder. She had intermittent hypoglycemia secondary to poor oral intake.  Her hemoglobin A1c was 7.  This will likely continue to trend down as she does not eat and drink. Her goal hemoglobin A1c is approximately 7.5 anyway so will discontinue her insulin.   Consultants:  None Procedures:  CT head  Chest x-ray Antibiotics:  Ceftriaxone from 6/19   Discharge Exam: Filed Vitals:   03/04/14 1024  BP: 138/79  Pulse: 62  Temp:   Resp:    Filed Vitals:   03/03/14 1329 03/03/14 2004 03/04/14 0509 03/04/14 1024  BP: 131/73  153/79 126/73 138/79  Pulse: 66 58 62 62  Temp: 97.5 F (36.4 C) 98 F (36.7 C) 97.9 F (36.6 C)   TempSrc: Oral Oral Oral   Resp: 15 18 18    Height:      Weight:   66.9 kg (147 lb 7.8 oz)   SpO2: 100% 100% 98%     General: BF, No acute distress  HEENT: NCAT, MMM  Cardiovascular: RRR, nl S1, S2 no mrg, 2+ pulses, warm extremities  Respiratory: CTAB, no increased WOB  Abdomen: NABS, soft, NT/ND MSK: Normal tone and bulk, trace bilateral LEE  Neuro: CN II-XII grossly intact, grossly moves all extremities   Discharge InstructionsMs.       Discharge Instructions   Call MD for:  difficulty breathing, headache or visual disturbances    Complete by:  As directed      Call MD for:  extreme fatigue    Complete by:  As directed      Call MD for:  hives    Complete by:  As directed      Call MD for:  persistant dizziness or light-headedness    Complete by:  As directed      Call MD for:  persistant nausea and vomiting    Complete by:  As directed      Call  MD for:  severe uncontrolled pain    Complete by:  As directed      Call MD for:  temperature >100.4    Complete by:  As directed      Discharge instructions    Complete by:  As directed   Ms. Gunia was hospitalized with urinary tract infection and dehydration.  Part of her dehydration is due to taking the diuretic lasix, which she should stop taking for now.  Part of her dehydration is due to progressive dementia.  Losing your appetite and forgetting how to eat are a natural part of the dementia process.  She should be offered her favorite foods as often as possible without regard to salt, fat, etc.  Drinks should be kept at bedside and she should be encouraged to drink small amounts frequently.  Her appetite will get worse whenever she has an infection and may improve some as she recovers from infection.  Her doctor should try to minimize her medications as much as possible and try medications which are liquid or absorbed under the  tongue.  We have made several changes since she has been in the hospital at your request, but please talk to her regular doctor also.  Although many years ago people used to use appetite stimulants and feeding tubes to try to help patients with dementia get better nutrition, we found they do not actually help and do not help people live longer.  Feeding tubes in particular become problematic because people who are confused tend to pull at them, causing them to bleed, come out or get infected.  In general, we recommend what I have recommended above, which is keeping your love one safe and comfortable and encouraging them gently with their favorite foods.  She will likely have recurrent hospitalizations for dehydration and recurrent infections which is normal as dementia worsens.  If you ever decide that you would like to transition to more comfort measures and avoid coming to the hospital, please let the hospital, her skilled nursing facility, or her home health agency know.     Increase activity slowly    Complete by:  As directed             Medication List    STOP taking these medications       cetirizine 10 MG tablet  Commonly known as:  ZYRTEC     digoxin 0.125 MG tablet  Commonly known as:  LANOXIN     esomeprazole 40 MG packet  Commonly known as:  NEXIUM     furosemide 20 MG tablet  Commonly known as:  LASIX     Ginkgo Biloba 40 MG Tabs     glucosamine-chondroitin 500-400 MG tablet     hydrALAZINE 10 MG tablet  Commonly known as:  APRESOLINE     insulin glargine 100 UNIT/ML injection  Commonly known as:  LANTUS     insulin lispro 100 UNIT/ML injection  Commonly known as:  HUMALOG     lisinopril 40 MG tablet  Commonly known as:  PRINIVIL,ZESTRIL     metoprolol succinate 100 MG 24 hr tablet  Commonly known as:  TOPROL-XL     multivitamin with minerals Tabs tablet     NUEDEXTA 20-10 MG Caps  Generic drug:  Dextromethorphan-Quinidine     simvastatin 40 MG tablet   Commonly known as:  ZOCOR      TAKE these medications       acetaminophen 500 MG tablet  Commonly known as:  TYLENOL  Take 500 mg by mouth every 4 (four) hours as needed for mild pain.     albuterol (2.5 MG/3ML) 0.083% nebulizer solution  Commonly known as:  PROVENTIL  Take 2.5 mg by nebulization every 4 (four) hours as needed for wheezing or shortness of breath.     aspirin 325 MG EC tablet  Take 1 tablet (325 mg total) by mouth daily.     cefTRIAXone 250 mg/mL in lidocaine (with preservative) injection  Inject 4 mLs (1,000 mg total) into the muscle daily.     cloNIDine 0.3 mg/24hr patch  Commonly known as:  CATAPRES - Dosed in mg/24 hr  Place 0.3 mg onto the skin once a week. On fridays     divalproex 125 MG capsule  Commonly known as:  DEPAKOTE SPRINKLE  Take 250 mg by mouth 2 (two) times daily.     donepezil 10 MG disintegrating tablet  Commonly known as:  ARICEPT ODT  Take 10 mg by mouth at bedtime.     feeding supplement (ENSURE COMPLETE) Liqd  Take 237 mLs by mouth 3 (three) times daily between meals.     loperamide 2 MG tablet  Commonly known as:  IMODIUM A-D  Take 2 mg by mouth 4 (four) times daily as needed for diarrhea or loose stools.     LORazepam 0.5 MG tablet  Commonly known as:  ATIVAN  Take 1 tablet (0.5 mg total) by mouth every 8 (eight) hours as needed for anxiety or sleep.     metoprolol 50 MG tablet  Commonly known as:  LOPRESSOR  Take 1 tablet (50 mg total) by mouth 2 (two) times daily.     polyvinyl alcohol 1.4 % ophthalmic solution  Commonly known as:  LIQUIFILM TEARS  Place 1 drop into both eyes 4 (four) times daily.     traMADol 50 MG tablet  Commonly known as:  ULTRAM  Take 1 tablet (50 mg total) by mouth every 6 (six) hours as needed for moderate pain or severe pain.       Follow-up Information   Follow up with [facility], [place]. Schedule an appointment as soon as possible for a visit in 2 weeks.   Specialty:  Internal  Medicine   Contact information:   [place] [address] [phone]        The results of significant diagnostics from this hospitalization (including imaging, microbiology, ancillary and laboratory) are listed below for reference.    Significant Diagnostic Studies: Ct Head Wo Contrast  02/28/2014   CLINICAL DATA:  Altered mental status  EXAM: CT HEAD WITHOUT CONTRAST  TECHNIQUE: Contiguous axial images were obtained from the base of the skull through the vertex without intravenous contrast.  COMPARISON:  Jan 21, 2013 head CT and Jan 22, 2013 brain MRI  FINDINGS: There is moderate diffuse atrophy there is no appreciable mass, hemorrhage, extra-axial fluid collection, or midline shift. There has been evolution of the infarct in the posterior left frontal lobe seen on prior examinations. Decreased attenuation is noted throughout this area currently. Currently there is widespread periventricular small vessel disease throughout the centra semiovale bilaterally. There are small prior lacunar infarcts in both external capsules as well as in the left and right alignment regions. The small posterior cerebellar infarct on the right seen as an acute finding on the previous study is not well seen on this current examination. There is some mild small vessel disease in the dentate nuclei of these cerebellar hemisphere.  No acute infarct is appreciable on this study.  The bony calvarium appears intact. The mastoid air cells are clear. There is diffuse opacification of left-sided mastoid air cells. There is opacification of the left middle ear, a new finding as well.  IMPRESSION: Atrophy with extensive small vessel disease and prior infarcts. There is no intracranial mass, hemorrhage, or acute appearing infarct currently. There is extensive left-sided mastoid air cell disease. There is also opacification in the left middle ear, a new finding.   Electronically Signed   By: Lowella Grip M.D.   On:  02/28/2014 07:56   Dg Chest Portable 1 View  02/28/2014   CLINICAL DATA:  Fatigue.  Altered mental status.  EXAM: PORTABLE CHEST - 1 VIEW  COMPARISON:  12/06/2010  FINDINGS: Chronic elevation of right hemidiaphragm noted. Mild cardiomegaly is stable. Both lungs are clear.  IMPRESSION: Stable mild cardiomegaly and elevation of right hemidiaphragm. No acute findings.   Electronically Signed   By: Earle Gell M.D.   On: 02/28/2014 07:12    Microbiology: Recent Results (from the past 240 hour(s))  CULTURE, BLOOD (ROUTINE X 2)     Status: None   Collection Time    02/28/14  6:55 AM      Result Value Ref Range Status   Specimen Description BLOOD RIGHT FOREARM   Final   Special Requests BOTTLES DRAWN AEROBIC AND ANAEROBIC 6CCS   Final   Culture  Setup Time     Final   Value: 02/28/2014 12:26     Performed at [facility]   Culture     Final   Value:        BLOOD CULTURE RECEIVED NO GROWTH TO DATE CULTURE WILL BE HELD FOR 5 DAYS BEFORE ISSUING A FINAL NEGATIVE REPORT     Performed at [facility]   Report Status PENDING   Incomplete  CULTURE, BLOOD (ROUTINE X 2)     Status: None   Collection Time    02/28/14  7:30 AM      Result Value Ref Range Status   Specimen Description BLOOD LEFT ANTECUBITAL   Final   Special Requests BOTTLES DRAWN AEROBIC AND ANAEROBIC 5CCS   Final   Culture  Setup Time     Final   Value: 02/28/2014 12:26     Performed at [facility]   Culture     Final   Value:        BLOOD CULTURE RECEIVED NO GROWTH TO DATE CULTURE WILL BE HELD FOR 5 DAYS BEFORE ISSUING A FINAL NEGATIVE REPORT     Performed at [facility]   Report Status PENDING   Incomplete  URINE CULTURE     Status: None   Collection Time    02/28/14  8:10 AM      Result Value Ref Range Status   Specimen Description URINE, CATHETERIZED   Final   Special Requests NONE   Final   Culture  Setup Time     Final   Value: 02/28/2014 12:53     Performed at [facility]     Final   Value: >=100,000 COLONIES/ML     Performed at [facility]   Culture     Final   Value: ESCHERICHIA COLI     Performed at [facility]   Report Status 03/02/2014 FINAL   Final   Organism ID, Bacteria ESCHERICHIA COLI   Final  Labs: Basic Metabolic Panel:  Recent Labs Lab 02/28/14 0700 02/28/14 0701 03/01/14 0425 03/02/14 0502 03/03/14 0315 03/04/14 0740  NA 162* 162* 162* 153* 148* 144  K 5.1 4.7 3.6* 3.6* 3.7 4.2  CL 120* 121* 121* 116* 114* 111  CO2 25  --  24 24 24 23   GLUCOSE 142* 139* 81 78 133* 146*  BUN 73* 77* 48* 25* 16 9  CREATININE 2.32* 2.60* 1.51* 1.18* 1.06 1.00  CALCIUM 10.1  --  9.3 8.6 8.5 8.6   Liver Function Tests:  Recent Labs Lab 02/28/14 0700 03/01/14 0425  AST 30 24  ALT 11 11  ALKPHOS 72 71  BILITOT 0.6 0.4  PROT 7.2 6.6  ALBUMIN 3.3* 3.0*   No results found for this basename: LIPASE, AMYLASE,  in the last 168 hours No results found for this basename: AMMONIA,  in the last 168 hours CBC:  Recent Labs Lab 02/28/14 0700 02/28/14 0701 03/02/14 0502 03/03/14 0315 03/04/14 0740  WBC 10.4  --  9.6 8.0 7.9  NEUTROABS 5.7  --   --   --  2.8  HGB 16.0* 17.3* 12.4 11.6* 12.2  HCT 49.5* 51.0* 38.2 35.9* 37.4  MCV 92.7  --  91.6 90.0 89.9  PLT 112*  --  86* 91* 96*   Cardiac Enzymes:  Recent Labs Lab 02/28/14 0948  TROPONINI <0.30   BNP: BNP (last 3 results) No results found for this basename: PROBNP,  in the last 8760 hours CBG:  Recent Labs Lab 03/03/14 1128 03/03/14 1646 03/03/14 2110 03/04/14 0745 03/04/14 1128  GLUCAP 120* 148* 157* 119* 135*    Time coordinating discharge: 45 minutes  Signed:  SHORT, MACKENZIE  [facility] 03/04/2014, 1:37 PM

## 2014-03-05 DIAGNOSIS — M129 Arthropathy, unspecified: Secondary | ICD-10-CM

## 2014-03-05 DIAGNOSIS — E43 Unspecified severe protein-calorie malnutrition: Secondary | ICD-10-CM | POA: Insufficient documentation

## 2014-03-05 LAB — BASIC METABOLIC PANEL
BUN: 6 mg/dL (ref 6–23)
CO2: 22 meq/L (ref 19–32)
CREATININE: 0.99 mg/dL (ref 0.50–1.10)
Calcium: 8.7 mg/dL (ref 8.4–10.5)
Chloride: 107 mEq/L (ref 96–112)
GFR calc non Af Amer: 51 mL/min — ABNORMAL LOW (ref >90)
GFR, EST AFRICAN AMERICAN: 59 mL/min — AB (ref >90)
Glucose, Bld: 153 mg/dL — ABNORMAL HIGH (ref 70–99)
Potassium: 4.1 mEq/L (ref 3.7–5.3)
Sodium: 143 mEq/L (ref 137–147)

## 2014-03-05 LAB — GLUCOSE, CAPILLARY
GLUCOSE-CAPILLARY: 160 mg/dL — AB (ref 70–99)
GLUCOSE-CAPILLARY: 143 mg/dL — AB (ref 70–99)
Glucose-Capillary: 123 mg/dL — ABNORMAL HIGH (ref 70–99)
Glucose-Capillary: 141 mg/dL — ABNORMAL HIGH (ref 70–99)

## 2014-03-05 MED ORDER — BOOST / RESOURCE BREEZE PO LIQD: 1.0000 | Freq: Two times a day (BID) | ORAL | Status: DC

## 2014-03-05 MED ORDER — ENSURE COMPLETE PO LIQD
237.0000 mL | Freq: Two times a day (BID) | ORAL | Status: DC
  Administered 2014-03-05: 237 mL via ORAL

## 2014-03-05 NOTE — Progress Notes (Signed)
CSW (Clinical Education officer, museum) continues to update facility.  [place], [place]

## 2014-03-05 NOTE — Care Management Note (Addendum)
    Page 1 of 2   03/06/2014     11:28:03 AM CARE MANAGEMENT NOTE 03/06/2014  Patient:  [name], [name]   Account Number:  [id number]  Date Initiated:  02/28/2014  Documentation initiated by:  [facility]  Subjective/Objective Assessment:   hypernatremia     Action/Plan:   from [facility]   Anticipated DC Date:  03/03/2014   Anticipated DC Plan:  [place]  In-house referral  Clinical Social Worker      DC Planning Services  CM consult      Choice offered to / List presented to:             Status of service:  Completed, signed off Medicare Important Message given?  YES (If response is "NO", the following Medicare IM given date fields will be blank) Date Medicare IM given:  03/03/2014 Date Additional Medicare IM given:  03/06/2014  Discharge Disposition:  [place]  Per UR Regulation:  Reviewed for med. necessity/level of care/duration of stay  If discussed at [place] of Stay Meetings, dates discussed:   03/06/2014    Comments:  03-05-14 1417 Jacqlyn Krauss, RN,BSN Late Entry from 03-04-14. CM received call that pt's daughter had called Medicare Judithann Graves) to appeal the d/c. Per daughter Jeanann Lewandowsky -she called to appeal the case due to the fact that she did not want her mother to return to [facility]. CM did discuss with daughter that if she had expressed her concerns upon arrival to hospital CSW could have worked on getting pt to another facility. CM also asked daughter if she had expressed her concerns with [facility] staff and she said yes, however nothing had been done. Daughter Jeanann Lewandowsky felt like her mother would not get her ABX therapy if she returned. CM did ask daughter if she wanted to retract the appeal once the appeal process was explained to daughter and she stated to go forth with the appeal. CM did get a telephone signature with the Witness of [place] and information was faxed to [facility]. Awaiting decision of [place] as of now.  Pt  is medically stable for d/c at this time. CSW assisting with SNF bed at [facility]. No further needs from CM at this time.  03/03/2014 1445 NCM spoke to pt (dsyphagia) and dtr, Restoni Zuniga # [phone] was able to speak for pt. Dtr states she is agreeable to pt going back to SNF, but her long term goal is have pt come live with her in her home. She has designed the home to be handicap accessible. Requested information on PACE program. Provided information to dtr on PACE program. Jonnie Finner RN CCM Case Mgmt phone [phone]  03-03-14 [address] [place] BSN [phone] Upon d/c pt will be d/c to SNF. CSW assisting with disposition needs.   02/28/2014 3:47 PM  UR completed. Jonnie Finner RN CCM Case Mgmt phone [phone]

## 2014-03-05 NOTE — Care Management Note (Signed)
Have faxed info to [facility] re: [facility] appeal of [place].   Fax # [phone] Phone# [phone] Contact is Theadora Rama

## 2014-03-05 NOTE — Progress Notes (Signed)
INITIAL NUTRITION ASSESSMENT  Patient meets the criteria for severe MALNUTRITION in the context of chronic illness with 11% weight loss in 1 months and PO intake <75% of estimated needs.   DOCUMENTATION CODES Per approved criteria  -Severe malnutrition in the context of chronic illness   INTERVENTION: -Ensure Complete BID, each provides 350 kcal, 13 g protein -Resource Breeze BID, each provides 250 kcal, 9 g protein -Recommend continuing supplements after discharge to improve nutritional status.  -Encourage small, frequent meals and sips of nutrition supplements regularly.   NUTRITION DIAGNOSIS: Inadequate oral intake related to advanced dementia as evidenced by minimal PO intake.   Goal: Patient will meet >/=90% of estimated nutrition needs  Monitor:  PO intake, weight, labs, I/Os  Reason for Assessment: Consult, assessment of nutrition status and requirements  80 y.o. female  Admitting Dx: Hypernatremia  ASSESSMENT: 80 year old female with history of CVA, CKD, DM, dysphagia, advanced Alzheimer's dementia. Admitted with hypernatremia, poor oral intake x 1 month.   Patient is a SNF resident with advanced dementia, so unable to provide history. Daughter present in the room. Per daughter, patient with poor oral intake for the last few week. She was eating well prior to that. She does eat better when family is feeding her. However, she is only taking several bites of meals. She has lost about 11% of her usual weight in a month. Nutrition focused physical exam performed, no muscle or subcutaneous fat wasting noted.   Ensure Complete ordered TID, patient not drinking much. Daughter states that she does not like this much, but will drink vanilla flavor. She does like juice, so may do well with Resource Breeze supplements.   Height: Ht Readings from Last 1 Encounters:  03/03/14 5\' 4"  (1.626 m)    Weight: Wt Readings from Last 1 Encounters:  03/04/14 147 lb 7.8 oz (66.9 kg)     Ideal Body Weight: 120 pounds  % Ideal Body Weight: 123%  Wt Readings from Last 10 Encounters:  03/04/14 147 lb 7.8 oz (66.9 kg)  02/10/14 162 lb (73.483 kg)  01/13/14 162 lb (73.483 kg)  01/21/13 167 lb 8 oz (75.978 kg)    Usual Body Weight: 165 pounds  % Usual Body Weight: 89%  BMI:  Body mass index is 25.3 kg/(m^2).  Patient is overweight.   Estimated Nutritional Needs: Kcal: 1350-1450 kcal Protein: 80-95 g Fluid: >2.0 L/day  Skin: Intact  Diet Order: Dysphagia 2, thin  EDUCATION NEEDS: -Education needs addressed   Intake/Output Summary (Last 24 hours) at 03/05/14 1131 Last data filed at 03/05/14 0900  Gross per 24 hour  Intake      0 ml  Output      0 ml  Net      0 ml    Last BM: 6/23   Labs:   Recent Labs Lab 03/03/14 0315 03/04/14 0740 03/05/14 0415  NA 148* 144 143  K 3.7 4.2 4.1  CL 114* 111 107  CO2 24 23 22   BUN 16 9 6   CREATININE 1.06 1.00 0.99  CALCIUM 8.5 8.6 8.7  GLUCOSE 133* 146* 153*    CBG (last 3)   Recent Labs  03/04/14 1634 03/04/14 2218 03/05/14 0723  GLUCAP 128* 133* 123*    Scheduled Meds: . aspirin EC  325 mg Oral Daily  . cefTRIAXone (ROCEPHIN)  IV  1 g Intravenous Q24H  . cloNIDine  0.3 mg Transdermal Q Fri-1800  . divalproex  250 mg Oral BID  . donepezil  10 mg Oral QHS  . feeding supplement (ENSURE COMPLETE)  237 mL Oral TID BM  . furosemide  20 mg Oral Daily  . insulin aspart  0-6 Units Subcutaneous TID WC  . metoprolol tartrate  50 mg Oral BID  . polyvinyl alcohol  1 drop Both Eyes QID    Continuous Infusions: . dextrose 5 % with KCl 20 mEq / L 20 mEq (03/03/14 1300)    Past Medical History  Diagnosis Date  . CVD (cerebrovascular disease)   . Hypertension   . High cholesterol   . Alzheimer's dementia   . Refusal of blood transfusions as patient is Jehovah's Witness   . CHF (congestive heart failure)   . Type II diabetes mellitus   . Anemia   . GERD (gastroesophageal reflux disease)   .  Headache(784.0)     "daily @ one point; don't think daily now" (03/04/2014)  . Migraine     "used to get one qod when working" (03/04/2014)  . TIA (transient ischemic attack)     "a few"  . Stroke     daughter denies residual on 03/04/2014  . Arthritis     "legs; feet" (03/04/2014)  . Depression   . Necrosis of toe     "right foot; nail popped off; never had OR"    Past Surgical History  Procedure Laterality Date  . Cataract extraction w/ intraocular lens  implant, bilateral Bilateral   . Cardiac catheterization  11/2001    Archie Endo 11/21/2001  . Cesarean section      Archie Endo 08/19/2010    Larey Seat, RD, LDN Pager #: [phone] After-Hours Pager #: [phone]

## 2014-03-05 NOTE — Progress Notes (Signed)
[facility] PROGRESS NOTE  PUJA CAFFEY XIP:[id number] DOB: 03-Jul-1927 DOA: 02/28/2014 PCP: Ezequiel Kayser, MD I have seen and examined this a.m., she is alert, disoriented. Denies any pain. Filed Vitals:   03/05/14 1451  BP: 114/70  Pulse: 71  Temp: 97.5 F (36.4 C)  Resp: 16   Exam:  General: BF, No acute distress  HEENT: NCAT, MMM  Cardiovascular: RRR, nl S1, S2 no mrg, 2+ pulses, warm extremities  Respiratory: CTAB, no increased WOB  Abdomen: NABS, soft, nontender, no rebound or guarding.  MSK: Normal tone and bulk, trace bilateral LEE  Neuro: CN II-XII grossly intact, grossly moves all extremities  Assessment/Plan  Hypernatremia, stable, partly rehydrated but still hypernatremic  - Resolved, follow and recheck in a.m. AKI on CKD.  -Likely due to dehydration and resolved with IVF and holding ACEI A-fib, currently rate controlled. Likely high falls risk given her dementia  - Continue aspirin  - Continue clonidine patch  - Continue metoprolol crushable version  -  digoxin was DC'd this hospital stay - Continue clonidine patch  Grade 2 diastolic dysfuction, at risk for hypervolemia due to persistent IVF  - continue current lasix  UTI  -Continue Rocephin   diabetes mellitus -Continue Accu-Cheks with sliding scale coverage -Patient remains medically stable for discharge to nursing facility but currently a waiting Medicaid review of appeal per daughter. Son at bedside updated.    [facility] Pager [phone]. If 7PM-7AM, please contact night-coverage at [email or web address], password [facility] 03/05/2014, 2:44 PM  LOS: 5 days

## 2014-03-06 DIAGNOSIS — R4182 Altered mental status, unspecified: Secondary | ICD-10-CM | POA: Insufficient documentation

## 2014-03-06 LAB — BASIC METABOLIC PANEL
BUN: 4 mg/dL — ABNORMAL LOW (ref 6–23)
CO2: 24 mEq/L (ref 19–32)
Calcium: 8.7 mg/dL (ref 8.4–10.5)
Chloride: 105 mEq/L (ref 96–112)
Creatinine, Ser: 1 mg/dL (ref 0.50–1.10)
GFR calc Af Amer: 58 mL/min — ABNORMAL LOW (ref >90)
GFR calc non Af Amer: 50 mL/min — ABNORMAL LOW (ref >90)
GLUCOSE: 145 mg/dL — AB (ref 70–99)
POTASSIUM: 4.2 meq/L (ref 3.7–5.3)
SODIUM: 139 meq/L (ref 137–147)

## 2014-03-06 LAB — CULTURE, BLOOD (ROUTINE X 2)
Culture: NO GROWTH
Culture: NO GROWTH

## 2014-03-06 LAB — GLUCOSE, CAPILLARY: GLUCOSE-CAPILLARY: 121 mg/dL — AB (ref 70–99)

## 2014-03-06 NOTE — Progress Notes (Signed)
Pt discharged to [facility] SNF per MD order. Pt alert and oriented to self at discharge.  Report given to receiving RN at [facility]. All belongings sent with pt. Pt transported via stretcher by [facility]. [name]

## 2014-03-06 NOTE — Care Management Note (Signed)
Received notification yesterday afternoon that [place] appeal had been reviewed and DC was determined to be appropriate. Patient liability begins today at noon, 03/06/14. Daughter has been advised of this by [place] at [place] [phone].

## 2014-03-06 NOTE — Progress Notes (Signed)
[facility] PROGRESS NOTE  [name] EXB:[id number] DOB: Sep 16, 1926 DOA: 02/28/2014 PCP: Sabrina Kayser, MD I have seen and examined this a.m., sleepy but arouses easily. Denies any pain. Filed Vitals:   03/06/14 0500  BP: 138/53  Pulse: 57  Temp: 97.7 F (36.5 C)  Resp: 20   Exam:  General: BF, No acute distress  HEENT: NCAT, MMM  Cardiovascular: RRR, nl S1, S2 no mrg, 2+ pulses, warm extremities  Respiratory: CTAB, no increased WOB  Abdomen: NABS, soft, nontender, no rebound or guarding.  MSK: Normal tone and bulk, trace bilateral LEE  Neuro: CN II-XII grossly intact, grossly moves all extremities  Assessment/Plan  Hypernatremia - Resolved with hydration, follow and recheck -remains stable for dc today AKI on CKD.  -Likely due to dehydration and resolved with IVF and holding ACEI A-fib, currently rate controlled. Likely high falls risk given her dementia  - Continue aspirin  - Continue clonidine patch  - Continue metoprolol crushable version  -  digoxin was DC'd this hospital stay Grade 2 diastolic dysfuction,  -at risk for hypervolemia with hydration this hospital stay>>d/c ivf  - continue current low dose lasix upon discharge>> nursing home M.D. to closely monitor fluid status in the light of her poor by mouth intake and further manage her Lasix as clinically appropriate. UTI  -Continue Rocephin as per d/c summary  diabetes mellitus -Continue Accu-Cheks with sliding scale coverage -Patient remains medically stable for discharge to nursing facility, per [place] Medicaid review of appeal per daughter completed and pt appropriate for d/c.Please see full discharge summary dated 6/23 per Dr. Sheran [name] for details of this hospitalization she remained unchanged.    [facility] Pager [phone]. If 7PM-7AM, please contact night-coverage at [email or web address], password [facility] 03/06/2014, 10:03 AM  LOS: 6 days

## 2014-03-06 NOTE — Progress Notes (Signed)
CSW (Clinical Education officer, museum) prepared pt dc packet and placed with shadow chart. CSW arranged non-emergent ambulance transport for 11am. Pt, pt daughter Peter Congo, pt nurse, and facility informed. CSW signing off.  [place], [place]

## 2014-03-06 NOTE — Progress Notes (Signed)
Patient ID: SHAQUAVIA WHISONANT, female   DOB: November 07, 1926, 80 y.o.   MRN: [id number]            PROGRESS NOTE  DATE: 02/26/2014        FACILITY:  [facility]  LEVEL OF CARE: SNF (31)  Acute Visit  CHIEF COMPLAINT:  Manage altered mental status.    HISTORY OF PRESENT ILLNESS: I was requested by the staff to assess the patient regarding above problem(s):  Staff report that patient is lethargic, sleeping all the time, noted since yesterday.  They cannot identify precipitating or alleviating factors.  There is no temporal relationship.  There are no other associated signs and symptoms.  Patient does not follow commands.    PAST MEDICAL HISTORY : Reviewed.  No changes/see problem list  CURRENT MEDICATIONS: Reviewed per MAR/see medication list  REVIEW OF SYSTEMS:  Unobtainable.  Patient is lethargic.    PHYSICAL EXAMINATION  VS:  T 97.8       P 80      RR 20      BP 142/74           GENERAL: no acute distress, normal body habitus EYES: unable to assess NECK: supple, trachea midline, no neck masses, no thyroid tenderness, no thyromegaly LYMPHATICS: no LAN in the neck, no supraclavicular LAN RESPIRATORY: breathing is even & unlabored, BS CTAB CARDIAC: RRR, no murmur,no extra heart sounds, no edema GI: abdomen soft, normal BS, no masses, no tenderness, no hepatomegaly, no splenomegaly PSYCHIATRIC: the patient is minimally alert, disoriented, decreased affect and mood      NEUROLOGICAL: the patient is lethargic, unable to perform a full exam         ASSESSMENT/PLAN:  Altered mental status.  New onset.  Significant problem.  We will hold Nuedexta and Depakote.  Check CBC with diff, CMP, UA, culture and sensitivities.     CPT CODE: 25366        Gayani Y Dasanayaka, [place] [phone]

## 2014-03-07 ENCOUNTER — Non-Acute Institutional Stay (SKILLED_NURSING_FACILITY): Payer: Medicare Other | Admitting: Internal Medicine

## 2014-03-07 DIAGNOSIS — I482 Chronic atrial fibrillation, unspecified: Secondary | ICD-10-CM

## 2014-03-07 DIAGNOSIS — I4891 Unspecified atrial fibrillation: Secondary | ICD-10-CM

## 2014-03-07 DIAGNOSIS — F0280 Dementia in other diseases classified elsewhere without behavioral disturbance: Secondary | ICD-10-CM

## 2014-03-07 DIAGNOSIS — G309 Alzheimer's disease, unspecified: Principal | ICD-10-CM

## 2014-03-07 DIAGNOSIS — N390 Urinary tract infection, site not specified: Secondary | ICD-10-CM

## 2014-03-07 DIAGNOSIS — E1059 Type 1 diabetes mellitus with other circulatory complications: Secondary | ICD-10-CM

## 2014-03-09 DIAGNOSIS — I4891 Unspecified atrial fibrillation: Secondary | ICD-10-CM | POA: Insufficient documentation

## 2014-03-09 DIAGNOSIS — N390 Urinary tract infection, site not specified: Secondary | ICD-10-CM | POA: Insufficient documentation

## 2014-03-09 NOTE — Progress Notes (Signed)
HISTORY & PHYSICAL  DATE: 03/07/2014   FACILITY: [facility]  LEVEL OF CARE: SNF (31)  ALLERGIES:  No Known Allergies  CHIEF COMPLAINT:  Manage Alzheimer's dementia, DM & a-fib  HISTORY OF PRESENT ILLNESS: 80 y/o AA female was hospitalized secondary to altered mental status.  After hospitalization she is readmitted to the facility for long term care management.  DEMENTIA: The dementia remaines stable and continues to function adequately in the current living environment with supervision.  The patient has had little changes in behavior. No complications noted from the medications presently being used.  Advanced.  Pt is a poor historian.  DM:pt's DM remains stable.  Staff  deny polyuria, polydipsia, polyphagia, changes in vision or hypoglycemic episodes.  No complications noted from the medication presently being used.  Last hemoglobin A1c is: 7.  ATRIAL FIBRILLATION: the patients atrial fibrillation remains stable.  The staff deny DOE, tachycardia, orthopnea, transient neurological sx, pedal edema, palpitations, & PNDs.  No complications noted from the medications currently being used.  PAST MEDICAL HISTORY :  Past Medical History  Diagnosis Date  . CVD (cerebrovascular disease)   . Hypertension   . High cholesterol   . Alzheimer's dementia   . Refusal of blood transfusions as patient is Jehovah's Witness   . CHF (congestive heart failure)   . Type II diabetes mellitus   . Anemia   . GERD (gastroesophageal reflux disease)   . Headache(784.0)     "daily @ one point; don't think daily now" (03/04/2014)  . Migraine     "used to get one qod when working" (03/04/2014)  . TIA (transient ischemic attack)     "a few"  . Stroke     daughter denies residual on 03/04/2014  . Arthritis     "legs; feet" (03/04/2014)  . Depression   . Necrosis of toe     "right foot; nail popped off; never had OR"    PAST SURGICAL HISTORY: Past Surgical History  Procedure  Laterality Date  . Cataract extraction w/ intraocular lens  implant, bilateral Bilateral   . Cardiac catheterization  11/2001    Archie Endo 11/21/2001  . Cesarean section      /notes 08/19/2010    SOCIAL HISTORY:  reports that she has never smoked. She has never used smokeless tobacco. She reports that she does not drink alcohol or use illicit drugs.  FAMILY HISTORY: None  CURRENT MEDICATIONS: Reviewed per MAR/see medication list  REVIEW OF SYSTEMS:  Unobtainable due to dementia  PHYSICAL EXAMINATION  VS:  See VS section  GENERAL: no acute distress, moderately obese body habitus EYES: unable to assess MOUTH/THROAT: unable to assess NECK: supple, trachea midline, no neck masses, no thyroid tenderness, no thyromegaly LYMPHATICS: no LAN in the neck, no supraclavicular LAN RESPIRATORY: breathing is even & unlabored, BS CTAB CARDIAC: heart rate is irregularly irregular, no murmur,no extra heart sounds, +1 BLE edema GI:  ABDOMEN: abdomen soft, normal BS, no masses, no tenderness  LIVER/SPLEEN: no hepatomegaly, no splenomegaly MUSCULOSKELETAL: HEAD: normal to inspection  EXTREMITIES:unable to assess PSYCHIATRIC: the patient is alert & unable to assess orientation, affect depressed & behavior appropriate  LABS/RADIOLOGY:  Labs reviewed: Basic Metabolic Panel:  Recent Labs  03/04/14 0740 03/05/14 0415 03/06/14 0350  NA 144 143 139  K 4.2 4.1 4.2  CL 111 107 105  CO2 23 22 24   GLUCOSE 146* 153* 145*  BUN 9 6 4*  CREATININE 1.00 0.99  1.00  CALCIUM 8.6 8.7 8.7   Liver Function Tests:  Recent Labs  02/28/14 0700 03/01/14 0425  AST 30 24  ALT 11 11  ALKPHOS 72 71  BILITOT 0.6 0.4  PROT 7.2 6.6  ALBUMIN 3.3* 3.0*   CBC:  Recent Labs  02/28/14 0700  03/02/14 0502 03/03/14 0315 03/04/14 0740  WBC 10.4  --  9.6 8.0 7.9  NEUTROABS 5.7  --   --   --  2.8  HGB 16.0*  < > 12.4 11.6* 12.2  HCT 49.5*  < > 38.2 35.9* 37.4  MCV 92.7  --  91.6 90.0 89.9  PLT 112*  --   86* 91* 96*  < > = values in this interval not displayed.  Cardiac Enzymes:  Recent Labs  02/28/14 0948  TROPONINI <0.30   CBG:  Recent Labs  03/05/14 1649 03/05/14 2040 03/06/14 0741  GLUCAP 141* 143* 121*    CT HEAD WITHOUT CONTRAST   TECHNIQUE: Contiguous axial images were obtained from the base of the skull through the vertex without intravenous contrast.   COMPARISON:  Jan 21, 2013 head CT and Jan 22, 2013 brain MRI   FINDINGS: There is moderate diffuse atrophy there is no appreciable mass, hemorrhage, extra-axial fluid collection, or midline shift. There has been evolution of the infarct in the posterior left frontal lobe seen on prior examinations. Decreased attenuation is noted throughout this area currently. Currently there is widespread periventricular small vessel disease throughout the centra semiovale bilaterally. There are small prior lacunar infarcts in both external capsules as well as in the left and right alignment regions. The small posterior cerebellar infarct on the right seen as an acute finding on the previous study is not well seen on this current examination. There is some mild small vessel disease in the dentate nuclei of these cerebellar hemisphere.   No acute infarct is appreciable on this study.   The bony calvarium appears intact. The mastoid air cells are clear. There is diffuse opacification of left-sided mastoid air cells. There is opacification of the left middle ear, a new finding as well.   IMPRESSION: Atrophy with extensive small vessel disease and prior infarcts. There is no intracranial mass, hemorrhage, or acute appearing infarct currently. There is extensive left-sided mastoid air cell disease. There is also opacification in the left middle ear, a new finding.   PORTABLE CHEST - 1 VIEW   COMPARISON:  12/06/2010   FINDINGS: Chronic elevation of right hemidiaphragm noted. Mild cardiomegaly is stable. Both lungs are  clear.   IMPRESSION: Stable mild cardiomegaly and elevation of right hemidiaphragm. No acute findings.    ASSESSMENT/PLAN:  Alzheimer's dementia-advanced.  Prognosis poor. DM-adequate control.  Pt has poor PO intake Atrial fibrillation-rate controlled. UTI-cont. Ceftriaxone CKD- renal fcns improved after IVF. Renovascular HTN-well controlled.  I have reviewed patient's medical records received at admission/from hospitalization.  CPT CODE: 17616  Lavoy Bernards Y Kaidynce Pfister, [place] [phone]

## 2014-03-14 ENCOUNTER — Non-Acute Institutional Stay (SKILLED_NURSING_FACILITY): Payer: Medicare Other | Admitting: Internal Medicine

## 2014-03-14 DIAGNOSIS — F0280 Dementia in other diseases classified elsewhere without behavioral disturbance: Secondary | ICD-10-CM

## 2014-03-14 DIAGNOSIS — G309 Alzheimer's disease, unspecified: Principal | ICD-10-CM

## 2014-03-14 DIAGNOSIS — E46 Unspecified protein-calorie malnutrition: Secondary | ICD-10-CM

## 2014-03-14 DIAGNOSIS — E870 Hyperosmolality and hypernatremia: Secondary | ICD-10-CM

## 2014-03-14 NOTE — Progress Notes (Signed)
Patient ID: [name], female   DOB: 01/04/1927, 80 y.o.   MRN: [id number] Facility; [facility] Chief complaint; review of severe protein calorie malnutrition, question G-tube placement History; this is a patient who has been in the building since 2014. She was recently in hospital at [facility] with hypernatremia [sodium at 162], albumin of 3 indicative of already moderate protein calorie malnutrition. She was also treated with Rocephin for a UTI. I received notes that her intake continues to be in the 25-50% range. Apparently one of the dietary managers spoke with the family and they are requesting a feeding tube.  I have review of her weights and there is no consistent trend here. Nevertheless it would appear over the last 6 months her weight has gone from 170 down to 164.  Review of systems Other than the marginal oral intake at 25-50%. She apparently takes 120-240 cc of supplement at times  Physical examination Gen. patient is not in any acute distress Vitals O2 sat is 94% on room air respirations 16 and unlabored pulse rate 84 Respiratory clear entry bilaterally Cardiac heart sounds are normal she does not appear to be dehydrated Abdomen obese no overt masses  Impression/plan #1 worsening protein calorie malnutrition in the setting of somebody who is listed as having Alzheimer's disease as well as late effect of a cerebrovascular accident. She was recently in hospital with hypernatremia with a serum sodium at 162 on admission. I will recheck this on Monday along with a serum albumin. She is listed as having severe Alzheimer's disease I think this is a reasonably correct assumption. In reviewing the discharge summary from the hospital there was time spent in discussing with the end-of-life issues with regards to advanced dementia. The patient however was still a full code on transfer here and the request for a feeding tube from a son is in keeping with this  approach to this  problem.  I will repeat her lab work on Monday. Leave a message for our service that this needs to be discussed with her family. I don't think there is any urgent issue to attempt to arrange something on the day before a holiday weekend however  a feeding tube in interventional radiology in the next week or two may not be an unreasonable approach if the family consents.

## 2014-03-17 ENCOUNTER — Non-Acute Institutional Stay (SKILLED_NURSING_FACILITY): Payer: Medicare Other | Admitting: Internal Medicine

## 2014-03-17 DIAGNOSIS — R627 Adult failure to thrive: Secondary | ICD-10-CM

## 2014-03-19 ENCOUNTER — Other Ambulatory Visit (HOSPITAL_BASED_OUTPATIENT_CLINIC_OR_DEPARTMENT_OTHER): Payer: Self-pay | Admitting: Internal Medicine

## 2014-03-19 ENCOUNTER — Other Ambulatory Visit: Payer: Self-pay | Admitting: Internal Medicine

## 2014-03-19 DIAGNOSIS — E46 Unspecified protein-calorie malnutrition: Secondary | ICD-10-CM

## 2014-03-19 IMAGING — CT CT HEAD W/O CM
1 of 2 series · 13 of 30 positions shown, 17 images · non-contrast
Comparison: 12/06/2010

***ADDENDUM*** CREATED: 01/21/2013 [DATE]

Dr. A Rodriguez was paged three separate times but did not return
call.  Therefore, these results were given to Dr. Adenyo at [DATE]
p.m..
***END ADDENDUM*** SIGNED BY: Loriley Fabrega, M.D.
CLINICAL DATA: Code stroke.  Decreased mental status.  Left gaze.
Right droop.
CT HEAD WITHOUT CONTRAST
TECHNIQUE: Contiguous axial images were obtained from the base of
the skull through the vertex without contrast.

[Series 2: brain · axial · 0.47mm/px · z∈[+121,+253]mm · 13 of 32 slices shown, 17 images]
[im 3/32  brain]
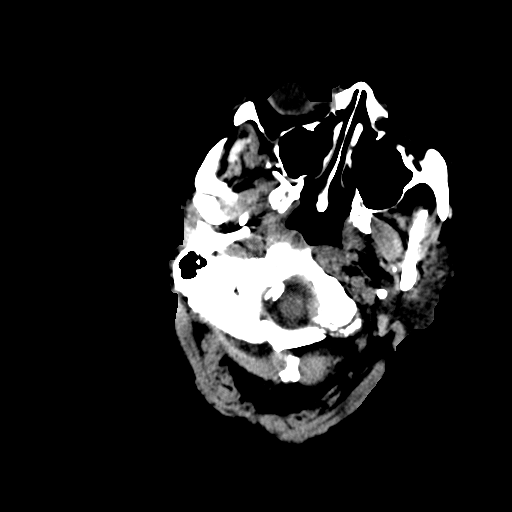
[im 3/32  bone]
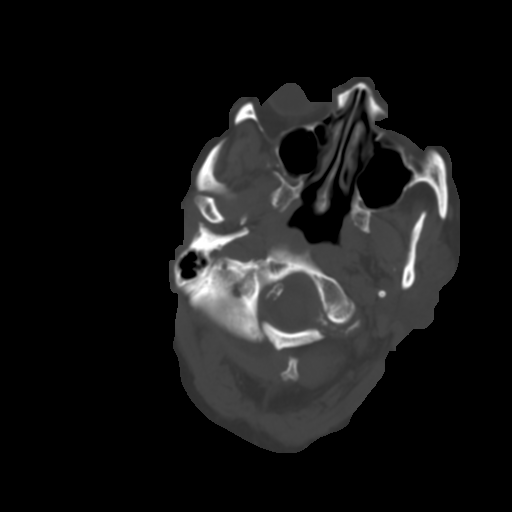
[im 5/32  brain]
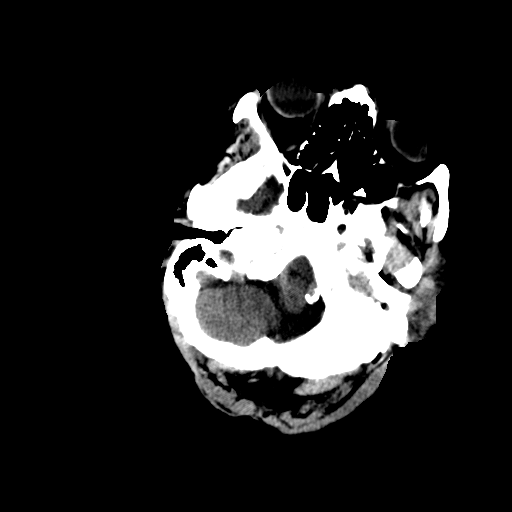
[im 7/32  brain]
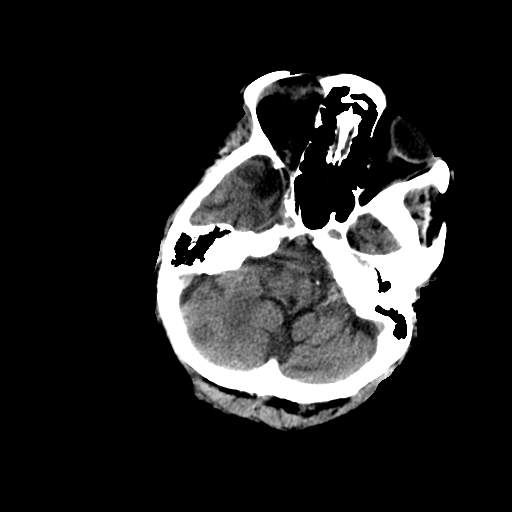
[im 9/32  brain]
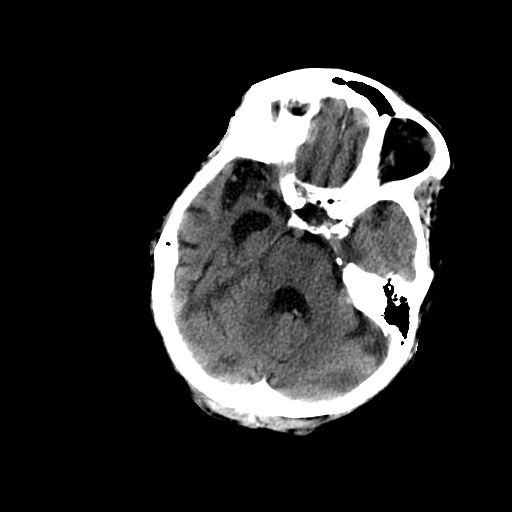
[im 12/32  brain]
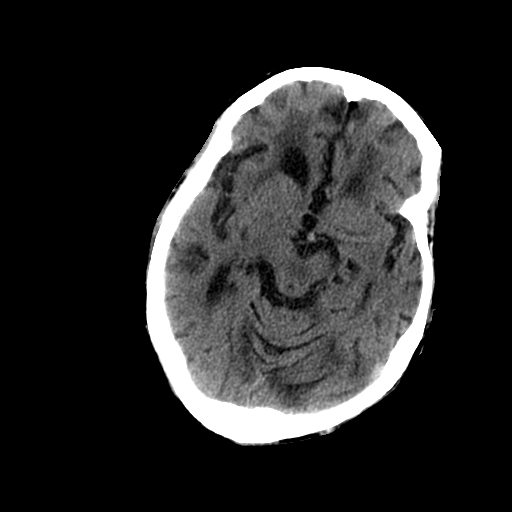
[im 12/32  bone]
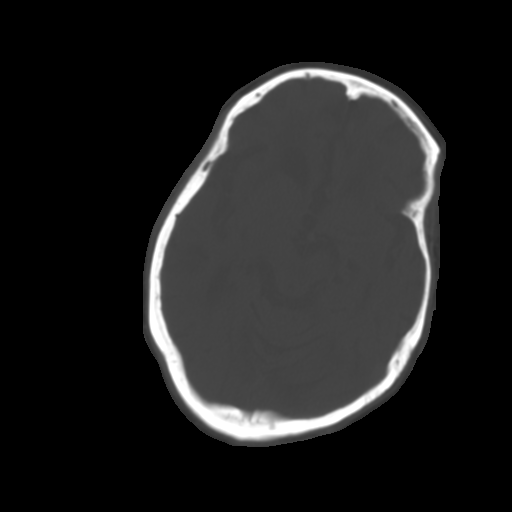
[im 14/32  brain]
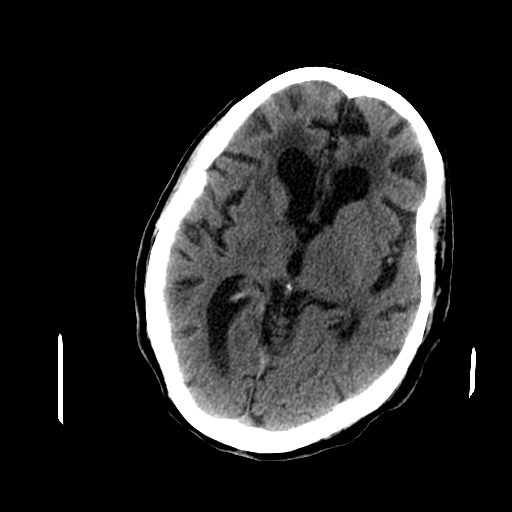
[im 16/32  brain]
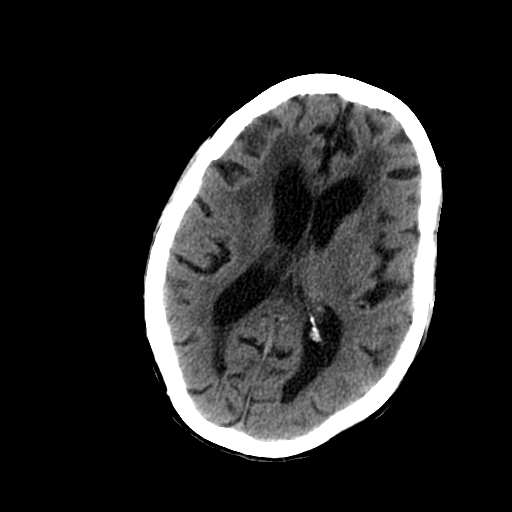
[im 18/32  brain]
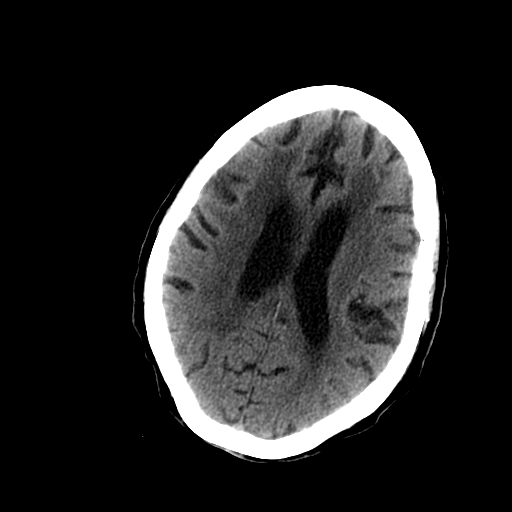
[im 20/32  brain]
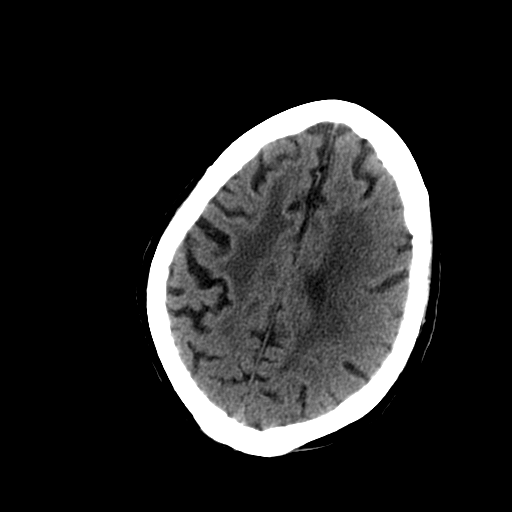
[im 20/32  bone]
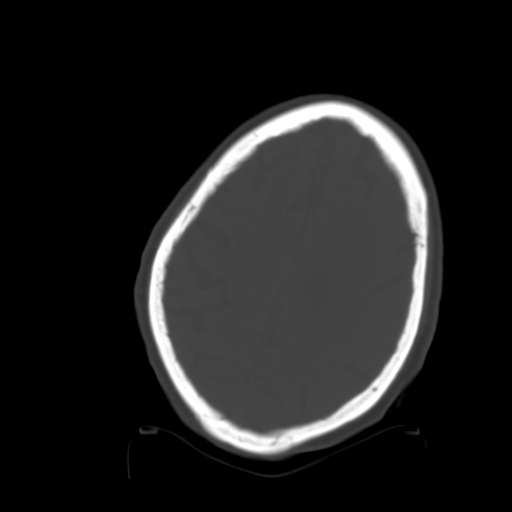
[im 23/32  brain]
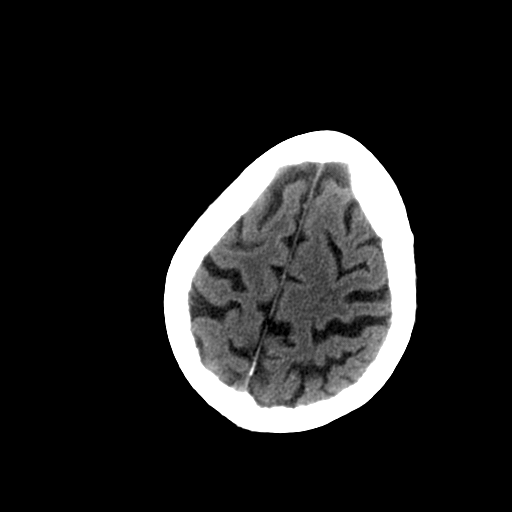
[im 25/32  brain]
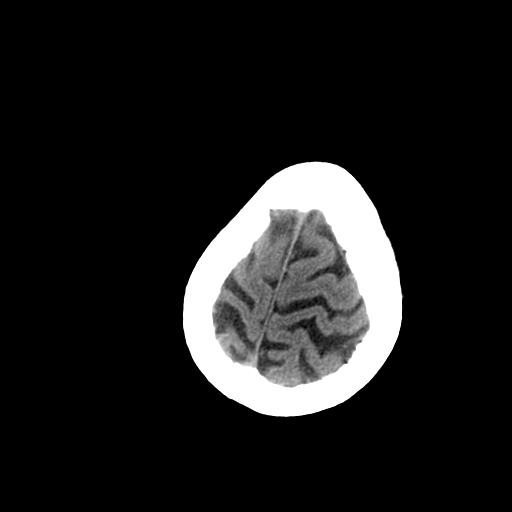
[im 27/32  brain]
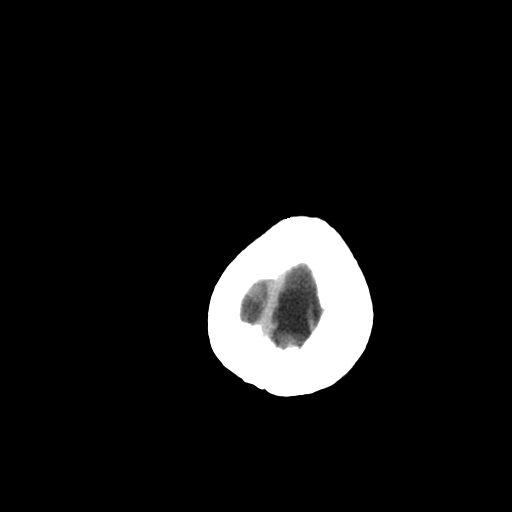
[im 29/32  brain]
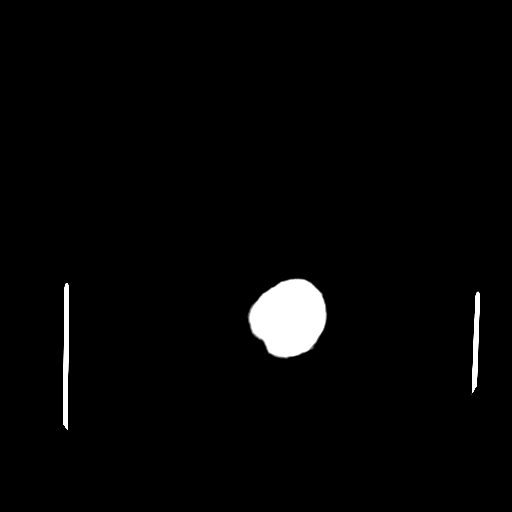
[im 29/32  bone]
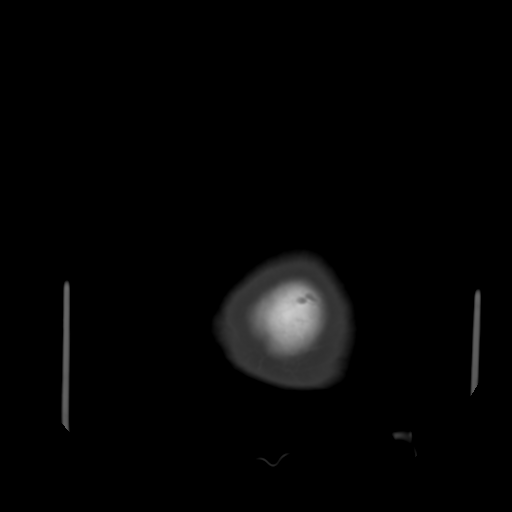

[13 of 30 positions shown; findings below may reference images not displayed]

FINDINGS: There is atrophy and chronic small vessel disease
changes. No acute intracranial abnormality.  Specifically, no
hemorrhage, hydrocephalus, mass lesion, acute infarction, or
significant intracranial injury.  No acute calvarial abnormality.
Visualized paranasal sinuses and mastoids clear.  Orbital soft
tissues unremarkable.
IMPRESSION: No acute intracranial abnormality.

Atrophy, chronic microvascular disease.

Critical Value/emergent results were called by telephone at the
time of interpretation on 01/22/2012 at [DATE] p.m. to Dr. A Rodriguez,
who verbally acknowledged these results.

## 2014-03-20 ENCOUNTER — Encounter (HOSPITAL_COMMUNITY): Payer: Self-pay

## 2014-03-20 ENCOUNTER — Other Ambulatory Visit: Payer: Self-pay | Admitting: Radiology

## 2014-03-20 ENCOUNTER — Ambulatory Visit (HOSPITAL_COMMUNITY)
Admission: RE | Admit: 2014-03-20 | Discharge: 2014-03-20 | Disposition: A | Discharge status: Home / Self Care | Payer: Medicare Other | Source: Ambulatory Visit | Attending: Internal Medicine | Admitting: Internal Medicine

## 2014-03-20 DIAGNOSIS — E46 Unspecified protein-calorie malnutrition: Secondary | ICD-10-CM | POA: Diagnosis not present

## 2014-03-20 LAB — CBC WITH DIFFERENTIAL/PLATELET
Basophils Absolute: 0 10*3/uL (ref 0.0–0.1)
Basophils Relative: 1 % (ref 0–1)
EOS ABS: 0.2 10*3/uL (ref 0.0–0.7)
Eosinophils Relative: 3 % (ref 0–5)
HCT: 37 % (ref 36.0–46.0)
HEMOGLOBIN: 12 g/dL (ref 12.0–15.0)
Lymphocytes Relative: 30 % (ref 12–46)
Lymphs Abs: 1.7 10*3/uL (ref 0.7–4.0)
MCH: 30 pg (ref 26.0–34.0)
MCHC: 32.4 g/dL (ref 30.0–36.0)
MCV: 92.5 fL (ref 78.0–100.0)
MONOS PCT: 8 % (ref 3–12)
Monocytes Absolute: 0.4 10*3/uL (ref 0.1–1.0)
NEUTROS ABS: 3.3 10*3/uL (ref 1.7–7.7)
NEUTROS PCT: 58 % (ref 43–77)
PLATELETS: 160 10*3/uL (ref 150–400)
RBC: 4 MIL/uL (ref 3.87–5.11)
RDW: 18.3 % — ABNORMAL HIGH (ref 11.5–15.5)
WBC: 5.6 10*3/uL (ref 4.0–10.5)

## 2014-03-20 LAB — PROTIME-INR
INR: 1.04 (ref 0.00–1.49)
PROTHROMBIN TIME: 13.6 s (ref 11.6–15.2)

## 2014-03-20 MED ORDER — MIDAZOLAM HCL 2 MG/2ML IJ SOLN
INTRAMUSCULAR | Status: AC | PRN
  Administered 2014-03-20: 1 mg via INTRAVENOUS
  Administered 2014-03-20: 0.5 mg via INTRAVENOUS
  Administered 2014-03-20: 2 mg via INTRAVENOUS
  Administered 2014-03-20: 0.5 mg via INTRAVENOUS

## 2014-03-20 MED ORDER — SODIUM CHLORIDE 0.9 % IV SOLN: INTRAVENOUS | Status: DC

## 2014-03-20 MED ORDER — GLUCAGON HCL RDNA (DIAGNOSTIC) 1 MG IJ SOLR
INTRAMUSCULAR | Status: AC
  Administered 2014-03-20: 1 mg
  Filled 2014-03-20: qty 1

## 2014-03-20 MED ORDER — CEFAZOLIN SODIUM-DEXTROSE 2-3 GM-% IV SOLR
INTRAVENOUS | Status: AC
  Filled 2014-03-20: qty 50

## 2014-03-20 MED ORDER — FENTANYL CITRATE 0.05 MG/ML IJ SOLN
INTRAMUSCULAR | Status: AC | PRN
  Administered 2014-03-20: 12.5 ug via INTRAVENOUS
  Administered 2014-03-20: 25 ug via INTRAVENOUS
  Administered 2014-03-20: 37.5 ug via INTRAVENOUS
  Administered 2014-03-20: 25 ug via INTRAVENOUS

## 2014-03-20 MED ORDER — HYDROCODONE-ACETAMINOPHEN 5-325 MG PO TABS: 1.0000 | ORAL_TABLET | ORAL | Status: DC | PRN

## 2014-03-20 MED ORDER — FENTANYL CITRATE 0.05 MG/ML IJ SOLN
INTRAMUSCULAR | Status: AC
  Filled 2014-03-20: qty 2

## 2014-03-20 MED ORDER — CEFAZOLIN SODIUM-DEXTROSE 2-3 GM-% IV SOLR
2.0000 g | Freq: Once | INTRAVENOUS | Status: AC
  Administered 2014-03-20: 2 g via INTRAVENOUS

## 2014-03-20 MED ORDER — MIDAZOLAM HCL 2 MG/2ML IJ SOLN
INTRAMUSCULAR | Status: AC
  Filled 2014-03-20: qty 4

## 2014-03-20 MED ORDER — IOHEXOL 300 MG/ML  SOLN
50.0000 mL | Freq: Once | INTRAMUSCULAR | Status: AC | PRN
  Administered 2014-03-20: 15 mL

## 2014-03-20 NOTE — Procedures (Signed)
Placement of 20 French gastrostomy tube without complication.  Plan to use tube on 03/21/14.

## 2014-03-20 NOTE — H&P (Signed)
[name] is an 80 y.o. female.   Chief Complaint: Pt scheduled today for percutaneous gastric tube placement AMS; dementia Combative Not taking anything by mouth Failure to thrive; protein calorie malnutrition Lives in [place] term care Scheduled for G tube per Dr Dellia Nims  HPI: Alzheimer dementia; HTN; HLD; CVD; CHF; DM; CVA;   Past Medical History  Diagnosis Date  . CVD (cerebrovascular disease)   . Hypertension   . High cholesterol   . Alzheimer's dementia   . Refusal of blood transfusions as patient is Jehovah's Witness   . CHF (congestive heart failure)   . Type II diabetes mellitus   . Anemia   . GERD (gastroesophageal reflux disease)   . Headache(784.0)     "daily @ one point; don't think daily now" (03/04/2014)  . Migraine     "used to get one qod when working" (03/04/2014)  . TIA (transient ischemic attack)     "a few"  . Stroke     daughter denies residual on 03/04/2014  . Arthritis     "legs; feet" (03/04/2014)  . Depression   . Necrosis of toe     "right foot; nail popped off; never had OR"    Past Surgical History  Procedure Laterality Date  . Cataract extraction w/ intraocular lens  implant, bilateral Bilateral   . Cardiac catheterization  11/2001    Archie Endo 11/21/2001  . Cesarean section      /notes 08/19/2010    No family history on file. Social History:  reports that she has never smoked. She has never used smokeless tobacco. She reports that she does not drink alcohol or use illicit drugs.  Allergies: No Known Allergies   (Not in a hospital admission)  Results for orders placed during the hospital encounter of 03/20/14 (from the past 48 hour(s))  CBC WITH DIFFERENTIAL     Status: Abnormal   Collection Time    03/20/14 10:26 AM      Result Value Ref Range   WBC 5.6  4.0 - 10.5 K/uL   RBC 4.00  3.87 - 5.11 MIL/uL   Hemoglobin 12.0  12.0 - 15.0 g/dL   HCT 37.0  36.0 - 46.0 %   MCV 92.5  78.0 - 100.0 fL   MCH 30.0  26.0 - 34.0 pg    MCHC 32.4  30.0 - 36.0 g/dL   RDW 18.3 (*) 11.5 - 15.5 %   Platelets 160  150 - 400 K/uL   Neutrophils Relative % 58  43 - 77 %   Neutro Abs 3.3  1.7 - 7.7 K/uL   Lymphocytes Relative 30  12 - 46 %   Lymphs Abs 1.7  0.7 - 4.0 K/uL   Monocytes Relative 8  3 - 12 %   Monocytes Absolute 0.4  0.1 - 1.0 K/uL   Eosinophils Relative 3  0 - 5 %   Eosinophils Absolute 0.2  0.0 - 0.7 K/uL   Basophils Relative 1  0 - 1 %   Basophils Absolute 0.0  0.0 - 0.1 K/uL  PROTIME-INR     Status: None   Collection Time    03/20/14 10:26 AM      Result Value Ref Range   Prothrombin Time 13.6  11.6 - 15.2 seconds   INR 1.04  0.00 - 1.49   No results found.  Review of Systems  Constitutional: Positive for weight loss. Negative for fever.  Respiratory: Negative for cough.   Neurological: Positive  for weakness.    Pulse 75, temperature 98.2 F (36.8 C), temperature source Oral, resp. rate 18, weight 74.39 kg (164 lb), SpO2 98.00%. Physical Exam  Constitutional: She appears well-developed.  Cardiovascular: Normal rate and normal heart sounds.   No murmur heard. Respiratory: Effort normal. She has no wheezes.  GI: Soft. Bowel sounds are normal. There is no tenderness.  Musculoskeletal:  Able to move all 4s Wearing B boots for necrotic toes per notes In wc now--unable to stand on own per transport person with her Combative per RN  Neurological:  Pt can answer some questions Knows name Prone to unusual outbursts  Skin: Skin is dry.  Psychiatric:  AMS Consented pts dtr via phone yesterday     Assessment/Plan FTT; PCM Living in nursing facility AMS; dementia Scheduled for Perc G tube in IR today Dtr aware of procedure benefits and risks and agreeable to proceed Consent signed and in chart--via phone  [place] A 03/20/2014, 11:11 AM

## 2014-03-20 NOTE — Discharge Instructions (Signed)
Gastrostomy Tube Home Guide A gastrostomy tube is a tube that is surgically placed into the stomach. It is also called a "G-tube." G-tubes are used when a person is unable to eat and drink enough on their own to stay healthy. The tube is inserted into the stomach through a small cut (incision)in the skin. This tube is used for:  Feeding.  Giving medication. GASTROSTOMY TUBE CARE  Wash your hands with soap and water.  Remove the old dressing (if any). Some styles of G-tubes may need a dressing inserted between the skin and the G-tube. Other types of G-tubes do not require a dressing. Ask your health care provider if a dressing is needed.  Check the area where the tube enters the skin (insertion site) for redness, swelling, or pus-like (purulent) drainage. A small amount of clear or tan liquid drainage is normal. Check to make sure scar tissue(skin) is not growing around the insertion site. This could have a raised, bumpy appearance.  A cotton swab can be used to clean the skin around the tube:  When the G-tube is first put in, a normal saline solution or water can be used to clean the skin.  Mild soap and warm water can be used when the skin around the G-tube site has healed.  Roll the cotton swab around the G-tube insertion site to remove any drainage or crusting at the insertion site. STOMACH RESIDUALS Feeding tube residuals are the amount of liquids that are in the stomach at any given time. Residuals may be checked before giving feedings, medications, or as instructed by your health care provider.  Ask your health care provider if there are instances when you would not start tube feedings depending on the amount or type of contents withdrawn from the stomach.  Check residuals by attaching a syringe to the G-tube and pulling back on the syringe plunger. Note the amount, and return the residual back into the stomach. FLUSHING THE G-TUBE  The G-tube should be periodically flushed with  clean warm water to keep it from clogging.  Flush the G-tube after feedings or medications. Draw up 30 mL of warm water in a syringe. Connect the syringe to the G-tube and slowly push the water into the tube.  Do not push feedings, medications, or flushes rapidly. Flush the G-tube gently and slowly.  Only use syringes made for G-tubes to flush medications or feedings.  Your health care provider may want the G-tube flushed more often or with more water. If this is the case, follow your health care provider's instructions. FEEDINGS Your health care provider will determine whether feedings are given as a bolus (a certain amount given at one time and at scheduled times) or whether feedings will be given continuously on a feeding pump.   Formulas should be given at room temperature.  If feedings are continuous, no more than 4 hours worth of feedings should be placed in the feeding bag. This helps prevent spoilage or accidental excess infusion.  Cover and place unused formula in the refrigerator.  If feedings are continuous, stop the feedings when medications or flushes are given. Be sure to restart the feedings.  Feeding bags and syringes should be replaced as instructed by your health care provider. GIVING MEDICATION   In general, it is best if all medications are in a liquid form for G-tube administration. Liquid medications are less likely to clog the G-tube.  Mix the liquid medication with 30 mL (or amount recommended by your health care  provider) of warm water.  Draw up the medication into the syringe.  Attach the syringe to the G-tube and slowly push the mixture into the G-tube.  After giving the medication, draw up 30 mL of warm water in the syringe and slowly flush the G-tube.  For pills or capsules, check with your health care provider first before crushing medications. Some pills are not effective if they are crushed. Some capsules are sustained release medications.  If  appropriate, crush the pill or capsule and mix with 30 mL of warm water. Using the syringe, slowly push the medication through the tube, then flush the tube with another 30 mL of tap water. G-TUBE PROBLEMS G-tube was pulled out.  Cause: May have been pulled out accidentally.  Solutions: Cover the opening with clean dressing and tape. Call your health care provider right away. The G-tube should be put in as soon as possible (within 4 hours) so the G-tube opening (tract) does not close. The G-tube needs to be put in at a health care setting. An X-ray needs to be done to confirm placement before the G-tube can be used again. Redness, irritation, soreness, or foul odor around the gastrostomy site.  Cause: May be caused by leakage or infection.  Solutions: Call your health care provider right away. Large amount of leakage of fluid or mucus-like liquid present (a large amount means it soaks clothing).  Cause: Many reasons could cause the G-tube to leak.  Solutions: Call your health care provider to discuss the amount of leakage. Skin or scar tissue appears to be growing where tube enters skin.   Cause: Tissue growth may develop around the insertion site if the G-tube is moved or pulled on excessively.  Solutions: Secure tube with tape so that excess movement does not occur. Call your health care provider. G-tube is clogged.  Cause: Thick formula or medication.  Solutions: Try to slowly push warm water into the tube with a large syringe. Never try to push any object into the tube to unclog it. Do not force fluid into the G-tube. If you are unable to unclog the tube, call your health care provider right away. TIPS  Head of bed (HOB) position refers to the upright position of a person's upper body.  When giving medications or a feeding bolus, keep the [facility] up as told by your health care provider. Do this during the feeding and for 1 hour after the feeding or medication administration.  If  continuous feedings are being given, it is best to keep the [facility] up as told by your health care provider. When ADLs (activities of daily living) are performed and the [facility] needs to be flat, be sure to turn the feeding pump off. Restart the feeding pump when the [facility] is returned to the recommended height.  Do not pull or put tension on the tube.  To prevent fluid backflow, kink the G-tube before removing the cap or disconnecting a syringe.  Check the G-tube length every day. Measure from the insertion site to the end of the G-tube. If the length is longer than previous measurements, the tube may be coming out. Call your health care provider if you notice increasing G-tube length.  Oral care, such as brushing teeth, must be continued.  You may need to remove excess air (vent) from the G-tube. Your health care provider will tell you if this is needed.  Always call your health care provider if you have questions or problems with the G-tube. SEEK  IMMEDIATE MEDICAL CARE IF:   You have severe abdominal pain, tenderness, or abdominal bloating (distension).  You have nausea or vomiting.  You are constipated or have problems moving your bowels.  The G-tube insertion site is red, swollen, has a foul smell, or has yellow or brown drainage.  You have difficulty breathing or shortness of breath.  You have a fever.  You have a large amount of feeding tube residuals.  The G-tube is clogged and cannot be flushed. MAKE SURE YOU:   Understand these instructions.  Will watch your condition.  Will get help right away if you are not doing well or get worse. Document Released: 11/07/2001 Document Revised: 09/03/2013 Document Reviewed: 05/06/2013 [facility] Patient Information 2015 [place], [place]. This information is not intended to replace advice given to you by your health care provider. Make sure you discuss any questions you have with your health care provider.

## 2014-03-20 NOTE — Progress Notes (Signed)
May do arterial stick for labs per Darryll Capers

## 2014-03-25 DIAGNOSIS — R627 Adult failure to thrive: Secondary | ICD-10-CM | POA: Insufficient documentation

## 2014-03-25 NOTE — Progress Notes (Signed)
Patient ID: [name], female   DOB: [birth year], 80 y.o.   MRN: [id number]            PROGRESS NOTE  DATE: 03/17/2014        FACILITY:  [facility]  LEVEL OF CARE: SNF (31)  Acute Visit  CHIEF COMPLAINT:  Manage failure to thrive.    HISTORY OF PRESENT ILLNESS: I was requested by the staff to assess the patient regarding above problem(s):  Due to advanced dementia, patient's p.o. intake is very poor.  Her meal intake is only 25-50%.  She is receiving Boost Plus with meals.  On 02/18/2014:  Weight was 164.  In May 2015:  Weight was 162.  In March 2015:  Weight was 167.  In February 2015:  Weight was 169.  In January 2015:  Weight was 170.  Patient overall is a poor historian.    PAST MEDICAL HISTORY : Reviewed.  No changes/see problem list  CURRENT MEDICATIONS: Reviewed per MAR/see medication list  REVIEW OF SYSTEMS:  Unobtainable due to dementia.    PHYSICAL EXAMINATION  VS:  T 96.7      P 76      RR 20      BP 134/86     POX 97% room air       WT (Lb) 162              GENERAL: no acute distress, moderately obese body habitus EYES: conjunctivae normal, sclerae normal, normal eye lids NECK: supple, trachea midline, no neck masses, no thyroid tenderness, no thyromegaly LYMPHATICS: no LAN in the neck, no supraclavicular LAN RESPIRATORY: breathing is even & unlabored, BS CTAB CARDIAC: RRR, no murmur,no extra heart sounds, +1 bilateral lower extremity edema     GI: abdomen soft, normal BS, no masses, no tenderness, no hepatomegaly, no splenomegaly PSYCHIATRIC: the patient is alert & oriented to person, affect & behavior appropriate  LABS/RADIOLOGY:   In 12/2013:  Albumin 3.4, total protein 5.8, otherwise liver profile normal.    ASSESSMENT/PLAN:  Failure to thrive.  New onset.  Significant problem.  Very likely due to dementia.  Check TSH.    I called her son, who is the power of attorney, and discussed feeding tube placement versus comfort measures.   Despite my recommendation for comfort measures, he insisted on feeding tube placement.  Therefore, we will request to proceed with that.    CPT CODE: 89373        Gayani Y Dasanayaka, [place] [phone]

## 2014-03-31 ENCOUNTER — Non-Acute Institutional Stay (SKILLED_NURSING_FACILITY): Payer: Medicare Other | Admitting: Internal Medicine

## 2014-03-31 DIAGNOSIS — R112 Nausea with vomiting, unspecified: Secondary | ICD-10-CM

## 2014-04-01 ENCOUNTER — Non-Acute Institutional Stay (SKILLED_NURSING_FACILITY): Payer: Medicare Other | Admitting: Internal Medicine

## 2014-04-01 DIAGNOSIS — G309 Alzheimer's disease, unspecified: Principal | ICD-10-CM

## 2014-04-01 DIAGNOSIS — F0280 Dementia in other diseases classified elsewhere without behavioral disturbance: Secondary | ICD-10-CM

## 2014-04-01 DIAGNOSIS — R063 Periodic breathing: Secondary | ICD-10-CM

## 2014-04-01 DIAGNOSIS — J690 Pneumonitis due to inhalation of food and vomit: Secondary | ICD-10-CM

## 2014-04-01 NOTE — Progress Notes (Signed)
Patient ID: [name], female   DOB: [birth year], 80 y.o.   MRN: [id number] Facility; [facility] Chief complaint; "not breathing" History; this is a 80 year old woman with advanced dementia and a history of cerebrovascular disease. She has been in the facility since 2014 although I do not know the case very well.Marland Kitchen She was recently in the hospital with hypernatremia and severe protein calorie malnutrition. Ultimately the family decided to place a percutaneous gastrostomy tube roughly 2 weeks ago. Over the weekend she had some vomiting with apparently residual feeding. Her tube feeding was put on hold and then restarted it. Apparently family visited today and noted labored breathing in fact they feel she had stopped breathing for up to 20-30 seconds . The patient is also not as responsive as per her daughter who is present since placement of the PEG tube.  Review of systems; this is not otherwise possible  Past medical history Cerebrovascular disease Alzheimer's dementia Hypertension Diabetes Hyperlipidemia Arthritis  Medications Ecotrin 325 daily Catapres-TTS 3 patch topically every week on Friday Depakote sprinkles 250 by mouth twice a day Lopressor 50 twice a day Aricept 10 mg dissolve on tongue every night. Recent placement of a G-tube to with free water flushes 200 every 4 DiaBeta source 55 cc per hour. Noted started on Reglan 5 mg every 6 due to retained gastric feeding over the weekend  Physical examination; temperature 90.6 for blood pressure 220/120 pulse rate 78 O2 sat is 95% on 2 L. Repeat blood pressure at 761 systolic Gen. patient is minimally responsive. Her the nurses normally she is awake enough to fight with staff Respiratory bibasilar crackles no wheezing he is. Her breathing pattern is clearly Cheyne-Stokes Cardiac heart sounds are distant her JVP is not elevated minimal edema Extremities left arm is swollen and cold. I would wonder about a central and peripheral  cardiac emboli syndrome Neurologic the patient is minimally responsive. Her left arm is cold. She appears flaccid  Impression/plan #1 clearly Cheyne-Stokes respirations. I would wonder about heart failure plus or minus aspiration pneumonitis plus or minus an acute neurologic event.  #2 possible accelerated hypertension #3 apparently there has been family disagreement about the aggressiveness of the care in this case so. The healthcare power of attorney is on her way at the moment I will have this discussion before we send the patient to the hospital. She does not have a DO NOT RESUSCITATE on the chart. A daughter who is present today is asking questions about the quality of her life etc. I am therefore waiting for the actual power of attorney to arrive before making a decision to move her to the hospital  Addendum; I discussed things in detail with the RP who arrived to. As of this moment [3:45 PM] they do not want her sent to the hospital. I will at least initiate a portable chest x-ray however we will need to make her a full DO NOT RESUSCITATE.

## 2014-04-02 DIAGNOSIS — J690 Pneumonitis due to inhalation of food and vomit: Secondary | ICD-10-CM

## 2014-04-03 ENCOUNTER — Non-Acute Institutional Stay (SKILLED_NURSING_FACILITY): Payer: Medicare Other | Admitting: Internal Medicine

## 2014-04-03 DIAGNOSIS — J690 Pneumonitis due to inhalation of food and vomit: Secondary | ICD-10-CM

## 2014-04-03 NOTE — Progress Notes (Signed)
Patient ID: [name], female   DOB: [birth year], 80 y.o.   MRN: [id number]           PROGRESS NOTE  DATE: 03/31/2014        FACILITY:  [facility]  LEVEL OF CARE: SNF (31)  Acute Visit  CHIEF COMPLAINT:  Manage nausea and vomiting.    HISTORY OF PRESENT ILLNESS: I was requested by the staff to assess the patient regarding above problem(s):  Staff report that patient's tube feeds were held due to large volume of vomiting on 03/29/2014.  Feeding was held for six hours, then began at 30 mL/hr and then increased to 40 mL/hr.  I was requested to assess the patient for further advancement of tube feeds.  Patient is a poor historian.    PAST MEDICAL HISTORY : Reviewed.  No changes/see problem list  CURRENT MEDICATIONS: Reviewed per MAR/see medication list  REVIEW OF SYSTEMS:  Unobtainable due to dementia.    PHYSICAL EXAMINATION  VS:  T 97.7       P 80      RR 20      BP 142/70          GENERAL: no acute distress, moderately obese body habitus NECK: supple, trachea midline, no neck masses, no thyroid tenderness, no thyromegaly RESPIRATORY: breathing is even & unlabored, BS CTAB CARDIAC: RRR, no murmur,no extra heart sounds, no edema GI: abdomen soft, no BS, no masses, no tenderness, no hepatomegaly, no splenomegaly, G-tube in place     PSYCHIATRIC: the patient is alert, disoriented, depressed affect and mood    ASSESSMENT/PLAN:  Nausea and vomiting.  Now resolved.  Agree with starting Reglan 5 mg q.6 hours.  Since no further  vomiting noted, advance tube feeds as tolerated to 55 mL/hr.    CPT CODE: 72094            [name] Y [name], [name] [phone]

## 2014-04-04 NOTE — Progress Notes (Addendum)
Patient ID: [name], female   DOB: February 24, 1927, 80 y.o.   MRN: [id number]            PROGRESS NOTE  DATE:  11-Apr-2014    FACILITY: [facility]    LEVEL OF CARE:   SNF   Acute Visit   CHIEF COMPLAINT:  Extreme pyrexia.    HISTORY OF PRESENT ILLNESS:  This is a patient whom I saw yesterday.  She has bilateral pneumonia in the setting of advanced terminal dementia.  The family elected not to hospitalize her and to pursue comfort measures in accordance to their understanding of the patient's wishes.     Today, she is noted to have a temperature of 106 in spite of Tylenol suppositories.    PHYSICAL EXAMINATION:   GENERAL APPEARANCE:  The patient is clearly in a pre-terminal state.  She is unresponsive and at least looks comfortable (regular Roxanol).    ASSESSMENT/PLAN:  End-of-life care.  I have prescribed ibuprofen suspension at 200 mg q.4 which can be given independently of the Tylenol for preterminal pyrexia.  I do not think she will make it through the night.

## 2014-04-12 DEATH — deceased

## 2015-05-16 IMAGING — XA IR PERC PLACEMENT GASTROSTOMY
1 series · 10 of 10 positions shown · non-contrast
Comparison: none

CLINICAL DATA: Malnutrition.

[Series 1: run · 10 of 10 slices shown]
[im 1/10]
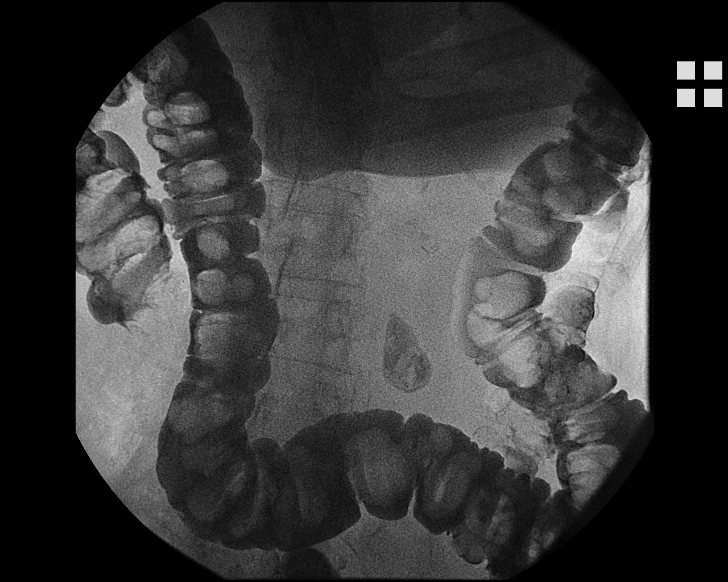
[im 2/10]
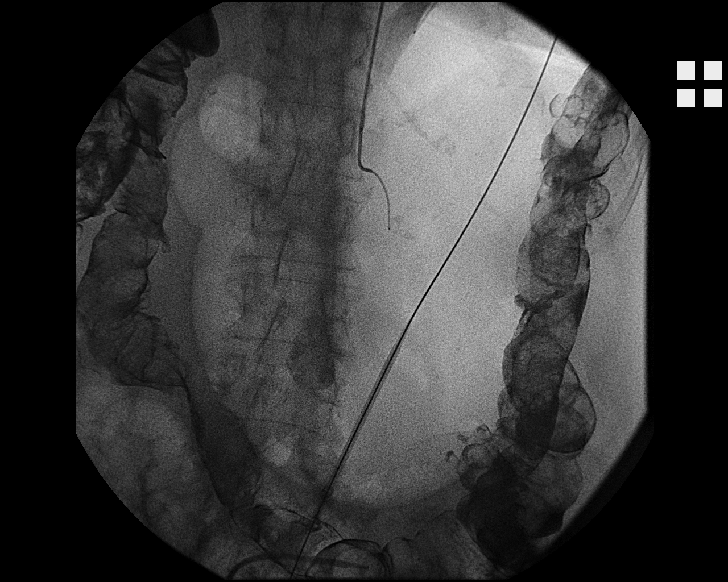
[im 3/10]
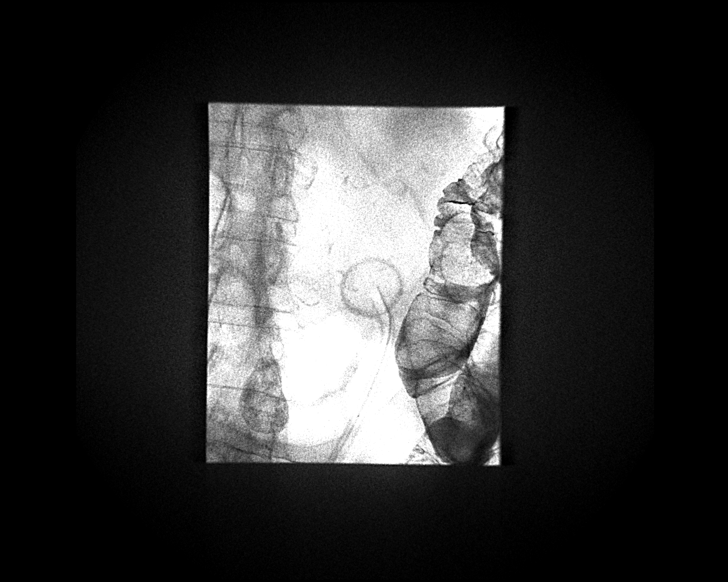
[im 4/10]
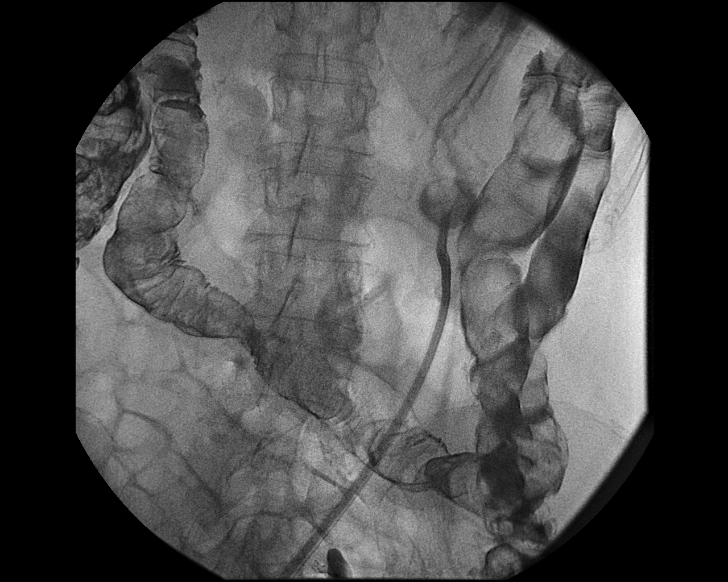
[im 5/10]
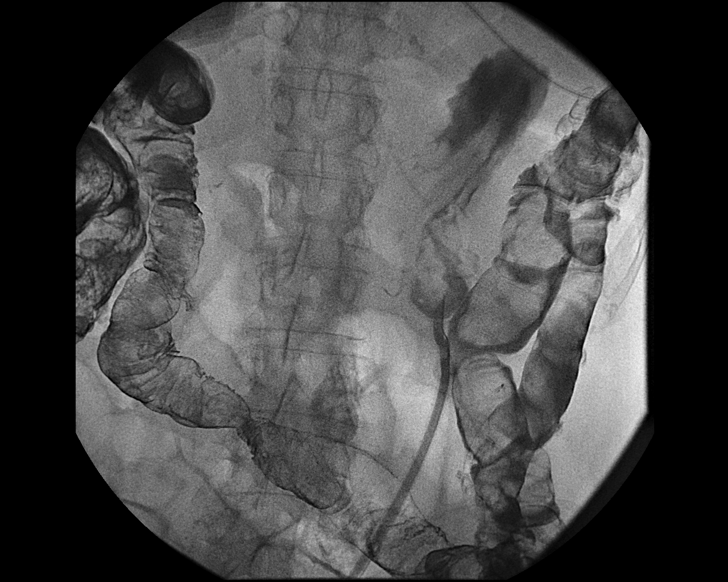
[im 6/10]
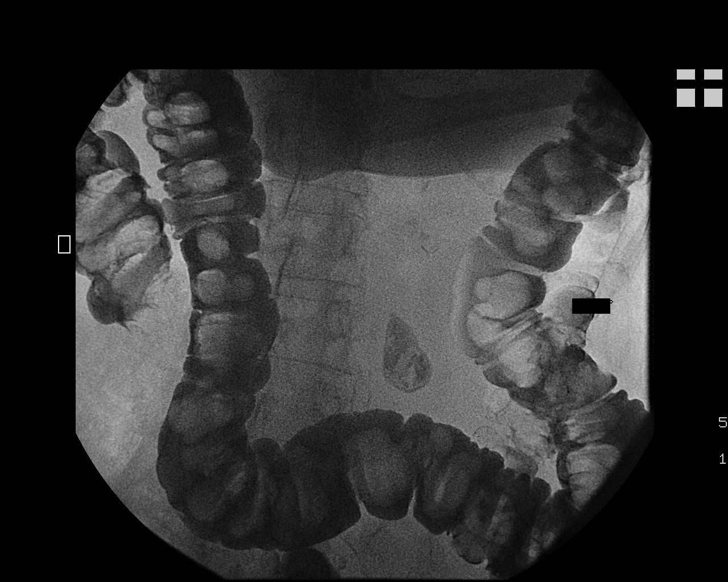
[im 7/10]
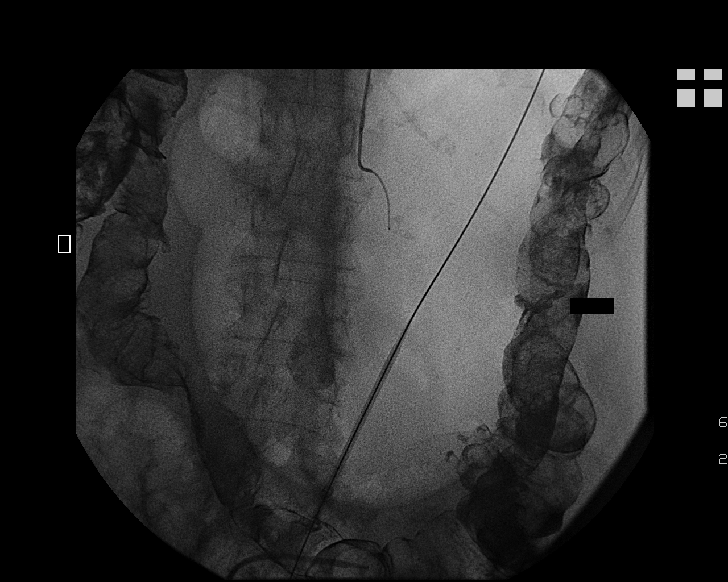
[im 8/10]
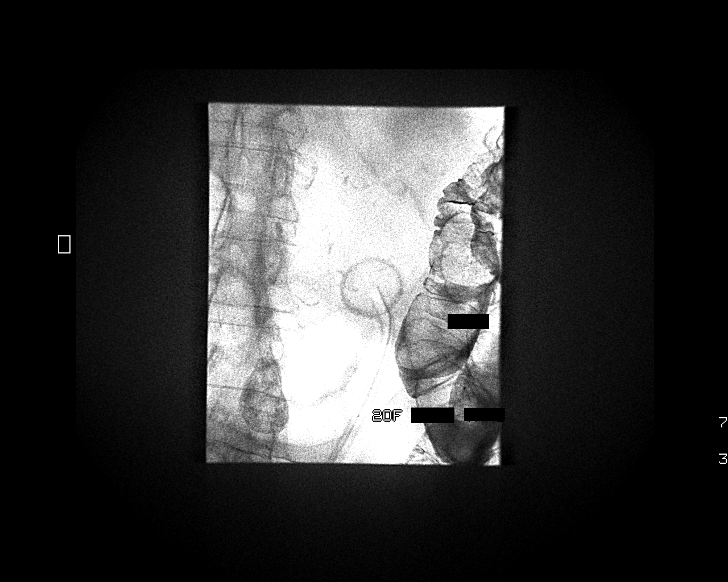
[im 9/10]
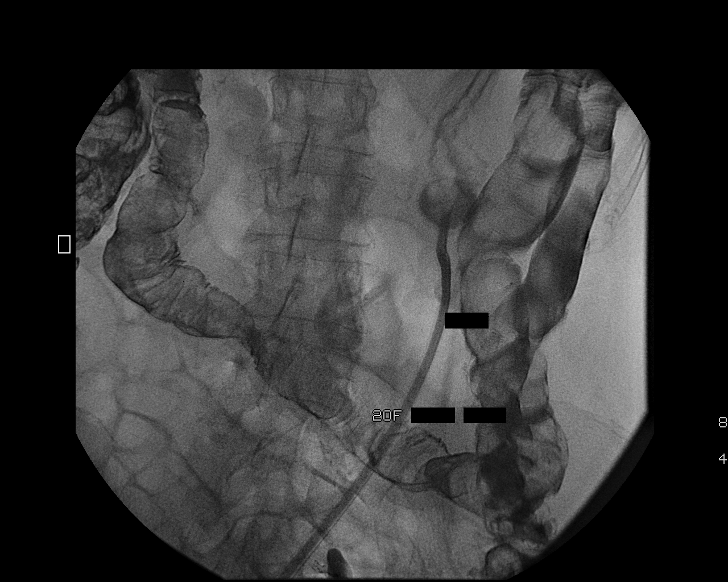
[im 10/10]
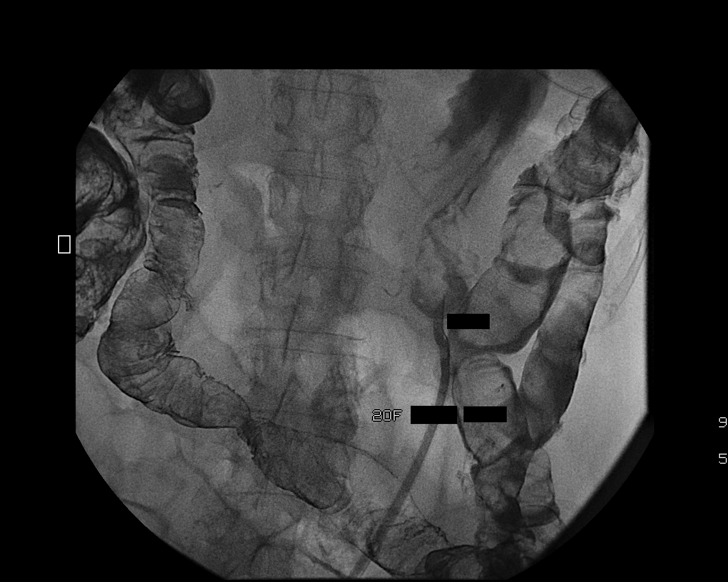

[10 of 10 positions shown; findings below may reference images not displayed]

EXAM:
PERCUTANEOUS GASTROSTOMY TUBE WITH FLUOROSCOPIC GUIDANCE

FLUOROSCOPY TIME:  13 min and 24 seconds

MEDICATIONS AND MEDICAL HISTORY:
Versed 4.0mg, Fentanyl 100 mcg, Ancef 2 gram. A radiology nurse
monitored the patient for moderate sedation. As antibiotic
prophylaxis, Ancef was ordered pre-procedure and administered
intravenously within one hour of incision.

ANESTHESIA/SEDATION:
Moderate sedation time: 30 minutes

PROCEDURE:
Informed consent was obtained for a percutaneous gastrostomy tube by
the patient's daughter. The patient was placed on the interventional
table. Fluoroscopy of the abdomen did not demonstrate any contrast
or significant gas within the bowel. A time-out was performed and
moderate sedation was given. After moderate sedation was given, a
barium enema was performed. The enema opacified the colon. An
orogastric tube was placed with fluoroscopic guidance. The anterior
abdomen was prepped and draped in sterile fashion. Maximal barrier
sterile technique was utilized including caps, mask, sterile gowns,
sterile gloves, sterile drape, hand hygiene and skin antiseptic.
Stomach was inflated with air through the orogastric tube. The skin
and subcutaneous tissues were anesthetized with 1% lidocaine. A 17
gauge needle was directed into the distended stomach with
fluoroscopic guidance. A wire was advanced into the stomach. A
9-French vascular sheath was placed and the orogastric tube was
snared using a [employer] snare device. The orogastric tube and snare
were pulled out of the patient's mouth. The snare device was
connected to a 20-French gastrostomy tube. The snare device and
gastrostomy tube were pulled through the patient's mouth and out the
anterior abdominal wall. The gastrostomy tube was cut to an
appropriate length. Contrast injection through gastrostomy tube
confirmed placement within the stomach. Fluoroscopic images were
obtained for documentation. The gastrostomy tube was flushed with
normal saline.
FINDINGS: Gastrostomy tube within the stomach.

COMPLICATIONS:
None
IMPRESSION: Successful fluoroscopic guided percutaneous gastrostomy tube
placement.
